# Patient Record
Sex: Male | Born: 1955 | Race: Black or African American | Hispanic: No | Marital: Married
Health system: Southern US, Community
[De-identification: ages and names within clinical notes are randomized; demographics above are authoritative.]

## PROBLEM LIST (undated history)

## (undated) DIAGNOSIS — I10 Essential (primary) hypertension: Secondary | ICD-10-CM

## (undated) DIAGNOSIS — M109 Gout, unspecified: Secondary | ICD-10-CM

## (undated) DIAGNOSIS — G4730 Sleep apnea, unspecified: Secondary | ICD-10-CM

## (undated) HISTORY — DX: Gout, unspecified: M10.9

## (undated) HISTORY — DX: Essential (primary) hypertension: I10

## (undated) HISTORY — PX: APPENDECTOMY (OPEN): SHX54

## (undated) HISTORY — PX: REPLACEMENT TOTAL KNEE BILATERAL: SUR1225

## (undated) HISTORY — DX: Sleep apnea, unspecified: G47.30

---

## 2013-10-08 ENCOUNTER — Other Ambulatory Visit (FREE_STANDING_LABORATORY_FACILITY): Payer: BLUE CROSS/BLUE SHIELD

## 2013-10-08 ENCOUNTER — Ambulatory Visit (INDEPENDENT_AMBULATORY_CARE_PROVIDER_SITE_OTHER): Payer: BLUE CROSS/BLUE SHIELD | Admitting: Internal Medicine

## 2013-10-08 ENCOUNTER — Encounter (INDEPENDENT_AMBULATORY_CARE_PROVIDER_SITE_OTHER): Payer: Self-pay | Admitting: Internal Medicine

## 2013-10-08 VITALS — BP 128/82 | HR 54 | Temp 98.2°F | Resp 12 | Ht 69.0 in | Wt 299.0 lb

## 2013-10-08 DIAGNOSIS — E559 Vitamin D deficiency, unspecified: Secondary | ICD-10-CM

## 2013-10-08 DIAGNOSIS — Z9889 Other specified postprocedural states: Secondary | ICD-10-CM

## 2013-10-08 DIAGNOSIS — M1A00X0 Idiopathic chronic gout, unspecified site, without tophus (tophi): Secondary | ICD-10-CM

## 2013-10-08 DIAGNOSIS — M1A9XX0 Chronic gout, unspecified, without tophus (tophi): Secondary | ICD-10-CM | POA: Insufficient documentation

## 2013-10-08 DIAGNOSIS — I10 Essential (primary) hypertension: Secondary | ICD-10-CM

## 2013-10-08 DIAGNOSIS — M170 Bilateral primary osteoarthritis of knee: Secondary | ICD-10-CM | POA: Insufficient documentation

## 2013-10-08 DIAGNOSIS — IMO0002 Reserved for concepts with insufficient information to code with codable children: Secondary | ICD-10-CM

## 2013-10-08 MED ORDER — ALLOPURINOL 100 MG PO TABS
100.0000 mg | ORAL_TABLET | Freq: Every day | ORAL | Status: DC
Start: 2013-10-08 — End: 2013-10-08

## 2013-10-08 MED ORDER — ALLOPURINOL 100 MG PO TABS
100.0000 mg | ORAL_TABLET | Freq: Every day | ORAL | Status: DC
Start: 2013-10-08 — End: 2013-12-26

## 2013-10-08 MED ORDER — DILTIAZEM HCL ER COATED BEADS 360 MG PO TB24
360.0000 mg | ORAL_TABLET | Freq: Every day | ORAL | Status: DC
Start: 2013-10-08 — End: 2013-12-26

## 2013-10-08 NOTE — Progress Notes (Signed)
1. Have you self referred yourself since we last saw you? no  Refer to care team   Or   Add specialists:

## 2013-10-08 NOTE — Progress Notes (Signed)
PROGRESS NOTE    Date Time: 10/08/2013 10:01 AM  Patient Name: [facility]    Chief Complaint   Patient presents with   . Medication Refill       Subjective:   Patient is a 58 y.o. male with PMH as below is here new patient Visit.  Patient has multiple medical complaints today.  Patient recently had a physical exam with his doctor who is a cardiologist in the [employer], would like to be screened for vitamin D deficiency.  Patient has a history of hypertension and is currently taken Cardizem 360 mg once daily.  Patient is also has a past medical history of gout, currently taken allopurinol as well.  Patient reports symptom has been stable on Cardizem, however, does not check his blood pressure regularly at home.  Denies any history of atrial fibrillation, diabetes, high cholesterol.  Patient reports Cardizem was given by his previous internal medicine physician.    Other topics discussed today including history of degenerated knee osteoarthritis of both knees.  Patient reports history of previous knee surgeries, both left and right, last surgery was on the right knee in February 2015.  Patient was given a temporary ?  Disability placard from the [facility] until this December, patient is requesting for continuation.  HISTORY:  Past Medical History   Diagnosis Date   . Hypertension    . Gout      Past Surgical History   Procedure Laterality Date   . Replacement total knee bilateral     . Appendectomy       Family History   Problem Relation Age of Onset   . Cancer Father      History     Social History   . Marital Status: Married     Spouse Name: N/A     Number of Children: N/A   . Years of Education: N/A     Social History Main Topics   . Smoking status: Never Smoker    . Smokeless tobacco: None   . Alcohol Use: 1.2 oz/week     2 Not specified per week   . Drug Use: No   . Sexual Activity:     Partners: Female     Other Topics Concern   . None     Social History Narrative   . None       There is no immunization history on  file for this patient.    Review of Systems:     Pertinent review of systems as in HPI.        Physical Exam:   BP 128/82 mmHg  Pulse 54  Temp(Src) 98.2 F (36.8 C) (Oral)  Resp 12  Ht 1.753 m (5\' 9" )  Wt 135.626 kg (299 lb)  BMI 44.13 kg/m2  SpO2 99% Body mass index is 44.13 kg/(m^2).    General appearance - sitting up in chair, comfortable   Eyes - pupils equal and reactive, normal conjunctiva  HEENT - supple, thyroid not enlarged   CV - normal S1, S2, no murmurs  Resp - normal respiratory effort, clear breath sound bilaterally   Abdomen - soft, non tender palpation, no splenomegaly   Musculoskeletal: normal gait, extremities with no clubbing     Assessment/Plan:     1. Essential hypertension  -blood pressure seems to be stable, however, patient does not check his blood pressure regularly.  Given history of gout.  Would stay away from drug choice which is hydrochlorothiazide.  Can continue Cardizem  for now.  However, would watch heart rate and disease in the mid 50s at this time.     2. H/O knee surgery  -history of knee surgeries due to degenerated arthritis from overuse from playing sports, patient is requesting for extension.  I discussed with the patient, we will need some medical records from the orthopedic doctor from [place] where he has surgery done, will need to touch base with his orthopedic to get his input on how much longer this patient needs.  Patient verbalized understanding    3. Osteoarthritis of both knees, unspecified osteoarthritis type  -as above    4. Vitamin D deficiency  Vitamin D- 25 Hydroxy (D2/D3/Total).  -Requested by his doctor from the [employer], I discussed that his insurance might not cover for this, patient verbalized understanding.     5. Chronic gout without tophus, unspecified cause, unspecified site  -currently taken allopurinol tolerating the medication well, continue this medication.  Monitor for potential side effects.        Signed by: Lynelle Doctor, MD  Internal Medicine

## 2013-10-10 LAB — QUESTASSURED(TM)25-OH VIT D(D2,D3), LC/MS/MS)>3YR)
Vitamin D 25-OH D2: <4 ng/mL
Vitamin D 25-OH D3: 16 ng/mL
Vitamin D, 25-OH, Total: 16 ng/mL — ABNORMAL LOW (ref 30–100)

## 2013-12-26 ENCOUNTER — Encounter (INDEPENDENT_AMBULATORY_CARE_PROVIDER_SITE_OTHER): Payer: Self-pay | Admitting: Internal Medicine

## 2013-12-26 ENCOUNTER — Ambulatory Visit (INDEPENDENT_AMBULATORY_CARE_PROVIDER_SITE_OTHER): Payer: BLUE CROSS/BLUE SHIELD | Admitting: Internal Medicine

## 2013-12-26 VITALS — BP 122/72 | HR 54 | Temp 98.9°F | Resp 12 | Ht 69.0 in | Wt 303.0 lb

## 2013-12-26 DIAGNOSIS — M1A9XX0 Chronic gout, unspecified, without tophus (tophi): Secondary | ICD-10-CM

## 2013-12-26 DIAGNOSIS — I10 Essential (primary) hypertension: Secondary | ICD-10-CM

## 2013-12-26 DIAGNOSIS — L910 Hypertrophic scar: Secondary | ICD-10-CM

## 2013-12-26 MED ORDER — ALLOPURINOL 100 MG PO TABS
100.0000 mg | ORAL_TABLET | Freq: Every day | ORAL | Status: DC
Start: 2013-12-26 — End: 2017-08-28

## 2013-12-26 MED ORDER — CLINDAMYCIN PHOSPHATE 1 % EX GEL
Freq: Two times a day (BID) | CUTANEOUS | Status: AC
Start: 2013-12-26 — End: 2014-03-26

## 2013-12-26 MED ORDER — ALLOPURINOL 100 MG PO TABS
100.0000 mg | ORAL_TABLET | Freq: Every day | ORAL | Status: DC
Start: 2013-12-26 — End: 2013-12-26

## 2013-12-26 MED ORDER — DILTIAZEM HCL ER COATED BEADS 360 MG PO TB24
360.00 mg | ORAL_TABLET | Freq: Every day | ORAL | Status: DC
Start: 2013-12-26 — End: 2017-08-28

## 2013-12-26 MED ORDER — DILTIAZEM HCL ER COATED BEADS 360 MG PO TB24
360.0000 mg | ORAL_TABLET | Freq: Every day | ORAL | Status: DC
Start: 2013-12-26 — End: 2013-12-26

## 2013-12-26 NOTE — Addendum Note (Signed)
Addended by: Agata Lucente G on: 12/26/2013 02:42 PM     Modules accepted: Orders

## 2013-12-26 NOTE — Progress Notes (Signed)
1. Have you self referred yourself since we last saw you? no  Refer to care team   Or   Add specialists:

## 2013-12-26 NOTE — Progress Notes (Signed)
PROGRESS NOTE    Date Time: 12/26/2013 10:23 AM  Patient Name: [facility]    Chief Complaint   Patient presents with   . Hypertension       Subjective:   Patient is a 58 y.o. male with PMH as below is here follow up visit for HTN and Gout.  Pt denies any chest pain, sob, palpitation, or diaphoresis today.  Reports he has been consistent with medications for Gout and HTN.  Blood pressure has been stable at home, in the 120s/80s on average.  Tolerating the medications with no significant SEs.    Other topics discussed today include hx of Keloid over his neck.  Pt has been taking Clindamycin gel in the past with good response.  Pt would like refill of this medication today.      HISTORY:  Past Medical History   Diagnosis Date   . Hypertension    . Gout      Past Surgical History   Procedure Laterality Date   . Replacement total knee bilateral     . Appendectomy       Family History   Problem Relation Age of Onset   . Cancer Father      History     Social History   . Marital Status: Married     Spouse Name: N/A     Number of Children: N/A   . Years of Education: N/A     Social History Main Topics   . Smoking status: Never Smoker    . Smokeless tobacco: None   . Alcohol Use: 1.2 oz/week     2 Not specified per week   . Drug Use: No   . Sexual Activity:     Partners: Female     Other Topics Concern   . None     Social History Narrative       There is no immunization history on file for this patient.    Review of Systems:     Pertinent review of systems as in HPI.        Physical Exam:   BP 122/72 mmHg  Pulse 54  Temp(Src) 98.9 F (37.2 C) (Oral)  Resp 12  Ht 1.753 m (5\' 9" )  Wt 137.44 kg (303 lb)  BMI 44.72 kg/m2  SpO2 98% Body mass index is 44.72 kg/(m^2).    General appearance - sitting up in chair, comfortable   Eyes - pupils equal and reactive, normal conjunctiva  HEENT - supple, thyroid not enlarged   CV - normal S1, S2, no murmurs  Resp - normal respiratory effort, clear breath sound bilaterally    Abdomen - soft, non tender palpation, no splenomegaly   Musculoskeletal: normal gait, extremities with no clubbing     Assessment/Plan:     1. Essential hypertension  Stable, cont Cardizem at current dosage, monitor for potential effects.  Blood pressure has been averaging in 120/80.     2. Chronic gout without tophus, unspecified cause, unspecified site  Stable, cont allopurinol.   3. Keloid  Stable, refill clindamycin gel.        Signed by: Lynelle Doctor, MD  Internal Medicine

## 2017-05-04 ENCOUNTER — Encounter: Payer: Self-pay | Admitting: Pain Medicine

## 2017-05-04 NOTE — Anesthesia Preprocedure Evaluation (Addendum)
Anesthesia Evaluation    AIRWAY    Mallampati: II    TM distance: >3 FB  Neck ROM: full  Mouth Opening:full  Planned to use difficult airway equipment: No CARDIOVASCULAR    cardiovascular exam normal, regular and normal       DENTAL         PULMONARY    pulmonary exam normal     OTHER FINDINGS                  Relevant Problems   (+) Essential hypertension       PSS Anesthesia Comments: HTN, Gout, Knee replacmnet, Morbid obesity, BMI 44,8. STOP BANG=6        Anesthesia Plan    ASA 3     general                     intravenous induction   Detailed anesthesia plan: general IV  Monitors/Adjuncts: other    Post Op: other  Post op pain management: other    informed consent obtained    Plan discussed with CRNA.                   Signed by: Juanell Fairly 05/04/17 6:42 PM

## 2017-05-07 ENCOUNTER — Ambulatory Visit
Admission: RE | Admit: 2017-05-07 | Discharge: 2017-05-07 | Disposition: A | Discharge status: Home / Self Care | Payer: BLUE CROSS/BLUE SHIELD | Source: Ambulatory Visit | Attending: Gastroenterology | Admitting: Gastroenterology

## 2017-05-07 ENCOUNTER — Ambulatory Visit: Payer: BLUE CROSS/BLUE SHIELD | Admitting: Pain Medicine

## 2017-05-07 ENCOUNTER — Encounter
Admission: RE | Disposition: A | Discharge status: Home / Self Care | Payer: Self-pay | Source: Ambulatory Visit | Attending: Gastroenterology

## 2017-05-07 DIAGNOSIS — I10 Essential (primary) hypertension: Secondary | ICD-10-CM | POA: Insufficient documentation

## 2017-05-07 DIAGNOSIS — K219 Gastro-esophageal reflux disease without esophagitis: Secondary | ICD-10-CM | POA: Insufficient documentation

## 2017-05-07 DIAGNOSIS — K449 Diaphragmatic hernia without obstruction or gangrene: Secondary | ICD-10-CM | POA: Insufficient documentation

## 2017-05-07 DIAGNOSIS — D120 Benign neoplasm of cecum: Secondary | ICD-10-CM

## 2017-05-07 DIAGNOSIS — K210 Gastro-esophageal reflux disease with esophagitis: Secondary | ICD-10-CM | POA: Insufficient documentation

## 2017-05-07 DIAGNOSIS — K5730 Diverticulosis of large intestine without perforation or abscess without bleeding: Secondary | ICD-10-CM | POA: Insufficient documentation

## 2017-05-07 DIAGNOSIS — Z1211 Encounter for screening for malignant neoplasm of colon: Secondary | ICD-10-CM | POA: Insufficient documentation

## 2017-05-07 DIAGNOSIS — K2970 Gastritis, unspecified, without bleeding: Secondary | ICD-10-CM | POA: Insufficient documentation

## 2017-05-07 DIAGNOSIS — D123 Benign neoplasm of transverse colon: Secondary | ICD-10-CM | POA: Insufficient documentation

## 2017-05-07 DIAGNOSIS — K635 Polyp of colon: Secondary | ICD-10-CM | POA: Insufficient documentation

## 2017-05-07 DIAGNOSIS — M109 Gout, unspecified: Secondary | ICD-10-CM | POA: Insufficient documentation

## 2017-05-07 DIAGNOSIS — K648 Other hemorrhoids: Secondary | ICD-10-CM | POA: Insufficient documentation

## 2017-05-07 HISTORY — PX: EGD, COLONOSCOPY: SHX3799

## 2017-05-07 SURGERY — EGD, COLONOSCOPY
Anesthesia: Anesthesia General | Site: Abdomen | Wound class: Clean Contaminated

## 2017-05-07 MED ORDER — FENTANYL CITRATE (PF) 50 MCG/ML IJ SOLN (WRAP)
INTRAMUSCULAR | Status: AC
Start: 2017-05-07 — End: ?
  Filled 2017-05-07: qty 2

## 2017-05-07 MED ORDER — PROPOFOL 10 MG/ML IV EMUL (WRAP)
INTRAVENOUS | Status: AC
Start: 2017-05-07 — End: ?
  Filled 2017-05-07: qty 20

## 2017-05-07 MED ORDER — LACTATED RINGERS IV SOLN
INTRAVENOUS | Status: DC
Start: 2017-05-07 — End: 2017-05-07

## 2017-05-07 MED ORDER — FENTANYL CITRATE (PF) 50 MCG/ML IJ SOLN (WRAP)
INTRAMUSCULAR | Status: DC | PRN
Start: 2017-05-07 — End: 2017-05-07
  Administered 2017-05-07: 50 ug via INTRAVENOUS

## 2017-05-07 MED ORDER — LIDOCAINE HCL 2 % IJ SOLN
INTRAMUSCULAR | Status: DC | PRN
Start: 2017-05-07 — End: 2017-05-07
  Administered 2017-05-07: 60 mg

## 2017-05-07 MED ORDER — LIDOCAINE HCL 2 % IJ SOLN
INTRAMUSCULAR | Status: AC
Start: 2017-05-07 — End: ?
  Filled 2017-05-07: qty 20

## 2017-05-07 MED ORDER — SODIUM CHLORIDE 0.9 % IV SOLN
INTRAVENOUS | Status: DC
Start: 2017-05-07 — End: 2017-05-07

## 2017-05-07 MED ORDER — GLYCOPYRROLATE 0.2 MG/ML IJ SOLN
INTRAMUSCULAR | Status: DC | PRN
Start: 2017-05-07 — End: 2017-05-07
  Administered 2017-05-07: .2 mg via INTRAVENOUS

## 2017-05-07 MED ORDER — PROPOFOL 10 MG/ML IV EMUL (WRAP)
INTRAVENOUS | Status: DC | PRN
Start: 2017-05-07 — End: 2017-05-07
  Administered 2017-05-07: 20 mg via INTRAVENOUS
  Administered 2017-05-07: 10 mg via INTRAVENOUS
  Administered 2017-05-07: 20 mg via INTRAVENOUS
  Administered 2017-05-07: 10 mg via INTRAVENOUS
  Administered 2017-05-07: 20 mg via INTRAVENOUS
  Administered 2017-05-07 (×4): 10 mg via INTRAVENOUS
  Administered 2017-05-07 (×2): 20 mg via INTRAVENOUS
  Administered 2017-05-07: 30 mg via INTRAVENOUS
  Administered 2017-05-07: 20 mg via INTRAVENOUS
  Administered 2017-05-07: 30 mg via INTRAVENOUS
  Administered 2017-05-07: 10 mg via INTRAVENOUS
  Administered 2017-05-07: 20 mg via INTRAVENOUS
  Administered 2017-05-07 (×5): 10 mg via INTRAVENOUS

## 2017-05-07 SURGICAL SUPPLY — 39 items
BLOCK BITE MAXI 60FR LF STRD STRAP SDPRT (Procedure Accessories) ×1
BLOCK BITE OD60 FR STURDY STRAP SIDEPORT (Procedure Accessories) ×1
BLOCK BITE OD60 FR STURDY STRAP SIDEPORT DENTAL RETENTION RIM MAXI (Procedure Accessories) ×1 IMPLANT
ELECTRODE ADULT PATIENT RETURN L9 FT REM POLYHESIVE ACRYLIC FOAM (Procedure Accessories) IMPLANT
ELECTRODE PATIENT RETURN L9 FT VALLEYLAB (Procedure Accessories)
ELECTRODE PT RTN RM PHSV ACRL FM C30- LB (Procedure Accessories)
FORCEPS BIOPSY L240 CM JUMBO MICROMESH (Instrument) ×1
FORCEPS BIOPSY L240 CM JUMBO MICROMESH TEETH STREAMLINE CATHETER (Instrument) IMPLANT
FORCEPS BIOPSY L240 CM LARGE CAPACITY (Instrument) ×1
FORCEPS BIOPSY L240 CM MICROMESH TEETH STREAMLINE CATHETER NEEDLE (Instrument) IMPLANT
FORCEPS BX SS JMB RJ 4 2.8MM 240CM STRL (Instrument) ×1
FORCEPS BX SS LG CPC RJ 4 2.4MM 240CM (Instrument) ×1
GLOVE EXAM LARGE NITRILE CHEMOTHERAPY POWDER FREE SENSE OATMEAL (Glove) ×1 IMPLANT
GLOVE EXAM LARGE NITRILE POWDER FREE SENSE OATMEAL (Glove) ×1 IMPLANT
GLOVE EXAM NITRILE RESTORE LG (Glove) ×1
GLV EXAM NITRILE RESTORE LG (Glove) ×2
GOWN ISL PP PE REG LG LF FULL BCK NK TIE (Gown) ×4
GOWN ISOLATION REGULAR LARGE FULL BACK NECK TIE ELASTIC CUFF (Gown) ×2 IMPLANT
MASK FLUID SHIELD W WRAP (Personal Protection) ×4 IMPLANT
NEEDLE CARR-LOCKE INJECT 25GX5 (Needles) IMPLANT
SNARE 9 MM L230 CM OD2.4 MM COLD BRAID (Instrument) ×1
SNARE 9 MM L230 CM OD2.4 MM EXACTO COLD BRAID WIRE CLEAN CUT (Instrument) IMPLANT
SNARE ESCP 9MM EXACTO 2.4MM 230CM LF (Instrument) ×1
SNARE ESCP MIC CPTVTR 13MM 240IN STRL (GE Lab Supplies)
SNARE SMALL HEXAGON CAPTIVATOR STIFF ENDOSCOPIC POLYPECTOMY (GE Lab Supplies) IMPLANT
SPONGE GAUZE L4 IN X W4 IN 16 PLY (Dressing) ×1
SPONGE GAUZE L4 IN X W4 IN 16 PLY MAXIMUM ABSORBENT USP TYPE VII (Dressing) ×1 IMPLANT
SPONGE GZE CTTN CRTY 4X4IN LF NS 16 PLY (Dressing) ×1
SYRINGE 50 ML GRADUATE NONPYROGENIC DEHP (Syringes, Needles)
SYRINGE 50 ML GRADUATE NONPYROGENIC DEHP FREE PVC FREE BD MEDICAL (Syringes, Needles) ×1 IMPLANT
SYRINGE MED 50ML LF STRL GRAD N-PYRG (Syringes, Needles)
TRAP MCS PLS LF STRL SCR CAP TUBE ID LBL (Procedure Accessories)
TRAP MUCUS SCREW CAP TUBE ID LABEL (Procedure Accessories)
TRAP MUCUS SCREW CAP TUBE ID LABEL MEDLINE PLASTIC CLEAR (Procedure Accessories) IMPLANT
TRAP SPEC REM ETRAP 15CM LF STRL MAGNIFY (Procedure Accessories) ×1
TRAP SPECIMEN REMOVAL L15 CM MAGNIFY (Procedure Accessories) ×1
TRAP SPECIMEN REMOVAL L15 CM MAGNIFY WINDOW MEASUREMENT GUIDE ETRAP (Procedure Accessories) IMPLANT
WATER STERILE PLASTIC POUR BOTTLE 250 ML (Irrigation Solutions) ×1 IMPLANT
WATER STRL 250ML LF PLS PR BTL (Irrigation Solutions) ×1

## 2017-05-07 NOTE — Discharge Instr - AVS First Page (Addendum)
Reason for your Hospital Admission:  ***      Instructions for after your discharge:  ***    Colonoscopy Discharge Instructions  General Instructions:  1. Following sedation, your judgement, perception, and coordination are considered impaired. Even though you may feel awake and alert, you are considered legally intoxicated. Therefore, until the next morning;   Do not Drive   Do not operate appliances or equipment that requires reaction time (e.g.Stove, electrical tools, machinery)   Do not sign legal documents or be involved in important decisions.   Do not smoke if alone   Do not drink alcoholic beverages   Go directly home and rest for several hours before resuming your routine activities.   It is highly recommended to have a responsible adult stay with you for the next 24 hours    2. Tenderness, swelling or pain may occur at the IV site where you received sedation. If you experience this, apply warm soaks to the area. Notify your physician if this persists.    Instructions Specific To Procedures - Report To Physician Any Of The Following:    Colon/Sigmoidoscopy/Proctoscopy   1. Severe and persistent abdominal pain/bloating which does not subside within 2-3 hours   2. Large amount of rectal bleeding (some mucosal blood streaking may occur, especially if biopsy or polypectomy was done or if hemorrhoids are present.   3. Nausea/vomitting   4. Fevers/Chills within 24 hours after procedure. Temp>101deg F     In Addition:   If polyp has been removed, DO NOT take aspirin or aspirin containing products (e.g. Anacin, Alka Seltzer, Bufferin, Etc.) or non-steroidal anti-inflammatory drugs (e.g. Advil, Motrin, etc.) for *** days unless otherwise advised by doctor. Tylenol  or extra Strength Tylenol is permitted.    Additional Discharge Instructions  Your diet after the procedure:  Start with something light (Toast, Jello, Soup, Etc.), Then Resume to Regular Diet as Tolerated. Nothing Spicy, Greasy or [employer]  for the [employer].  Special Instructions: ***  Prescriptions given: {YES (DEF)/NO:21773}  Patient education literature given; {YES (DEF)/NO:21773}      If you have questions or problems contact your MD immediately. If you need immediate attention, call your MD, 911 and/or go to nearest emergency room.  Endoscopy Discharge Instructions  General Instructions:  1. Following sedation, your judgement, perception, and coordination are considered impaired. Even though you may feel awake and alert, you are considered legally intoxicated. Therefore, until the next morning;   Do not Drive   Do not operate appliances or equipment that requires reaction time (e.g. stove, electrical tools, machinery)   Do not sign legal documents or be involved in important decisions.   Do not smoke if alone   Do not drink alcoholic beverages   Go directly home and rest for several hours before resuming your routine activities.   It is highly recommended to have a responsible adult stay with you for the next 24 hours    2. Tenderness, swelling or pain may occur at the IV site where you received sedation. If you experience this, apply warm soaks to the area. Notify your physician if this persists.    Instructions Specific To Procedures - Report To Physician Any Of The Following:    Upper Endoscopy, ERCP, Dilations   1. Pain in Chest   2. Nausea/vomitting   3. Fevers/Chills within 24 hours after procedure. Temp>101deg F   4. Severe and persistent abdominal pain and bloating     In Addition:  Mild throat soreness may follow this procedure. Warm salt water gargling or    lozenges of your choice will most likely relieve your discomfort or cold drinks and   popsicles.         BAdditional Discharge Instructions  Your diet after the procedure: {Gi diet instructions:110015}  Special Instructions No red dye foods for 24 hours  Prescriptions given: {YES (DEF)/NO:21773}  Patient education literature given; {YES (DEF)/NO:21773}      If you have  questions or problems contact your MD immediately. If you need immediate attention, call your MD, 911 and/or go to nearest emergency room.

## 2017-05-07 NOTE — H&P (Signed)
GI PRE PROCEDURE NOTE    Proceduralist Comments:   Review of Systems and Past Medical / Surgical History performed: Yes     Indications:GERD  and Colon cancer screening    Previous Adverse Reaction to Anesthesia or Sedation (if yes, describe): No    Physical Exam / Laboratory Data (If applicable)   General: Alert and cooperative  Lungs: Lungs clear to auscultation  Cardiac: RRR, normal S1S2.    Abdomen: Soft, non tender. Normal active bowel sounds  Other:     No labs drawn    American Society of Anesthesiologists (ASA) Physical Status Classification:   Anesthesia ASA Score: 3          Planned Sedation:   Deep sedation with anesthesia    Attestation:   Kurt Bates has been reassessed immediately prior to the procedure and is an appropriate candidate for the planned sedation and procedure. Risks, benefits and alternatives to the planned procedure and sedation have been explained to the patient or guardian:  yes        Signed by: Annalee Genta

## 2017-05-07 NOTE — Transfer of Care (Signed)
A96%, HR = nesthesia Transfer of Care Note  , ,   Patient: Kurt Bates    Procedures performed: Procedure(s) with comments:  EGD, COLONOSCOPY - EGD, COLONOSCOPY  Q1=UNK    Anesthesia type: General TIVA    Patient location:Phase II PACU    Last vitals:   sats = 96%, HR = 62, 132/82    Post pain: Patient not complaining of pain, continue current therapy      Mental Status:awake    Respiratory Function: tolerating room air    Cardiovascular: stable    Nausea/Vomiting: patient not complaining of nausea or vomiting    Hydration Status: adequate    Post assessment: no apparent anesthetic complications, no reportable events and no evidence of recall     VSS< report given    Signed by: Betsy Pries  05/07/17 3:00 PM    //

## 2017-05-07 NOTE — Anesthesia Postprocedure Evaluation (Signed)
Anesthesia Post Evaluation    Patient: Kurt Bates    Procedure(s) with comments:  EGD, COLONOSCOPY - EGD, COLONOSCOPY  Q1=UNK    Anesthesia type: general    Last Vitals:   Vitals:    05/07/17 1530   BP: 139/90   Pulse: (!) 50   Resp: 16   Temp:    SpO2: 96%       Anesthesia Post Evaluation:     Patient Evaluated: PACU  Patient Participation: complete - patient participated  Level of Consciousness: awake  Pain Score: 0  Pain Management: adequate    Airway Patency: patent    Anesthetic complications: No      PONV Status: none    Cardiovascular status: stable  Respiratory status: room air  Hydration status: stable        Signed by: Juanell Fairly, 05/07/2017 3:46 PM

## 2017-05-08 ENCOUNTER — Encounter: Payer: Self-pay | Admitting: Gastroenterology

## 2017-05-15 ENCOUNTER — Encounter (INDEPENDENT_AMBULATORY_CARE_PROVIDER_SITE_OTHER): Payer: Self-pay | Admitting: Internal Medicine

## 2017-05-15 DIAGNOSIS — Z86010 Personal history of colonic polyps: Secondary | ICD-10-CM | POA: Insufficient documentation

## 2017-08-28 ENCOUNTER — Ambulatory Visit (HOSPITAL_BASED_OUTPATIENT_CLINIC_OR_DEPARTMENT_OTHER): Payer: BLUE CROSS/BLUE SHIELD | Admitting: Surgery

## 2017-08-28 ENCOUNTER — Encounter (HOSPITAL_BASED_OUTPATIENT_CLINIC_OR_DEPARTMENT_OTHER): Payer: Self-pay | Admitting: Surgery

## 2017-08-28 DIAGNOSIS — K449 Diaphragmatic hernia without obstruction or gangrene: Secondary | ICD-10-CM

## 2017-08-28 DIAGNOSIS — I10 Essential (primary) hypertension: Secondary | ICD-10-CM

## 2017-08-28 DIAGNOSIS — M1A9XX0 Chronic gout, unspecified, without tophus (tophi): Secondary | ICD-10-CM

## 2017-08-28 DIAGNOSIS — Z6841 Body Mass Index (BMI) 40.0 and over, adult: Secondary | ICD-10-CM

## 2017-08-28 DIAGNOSIS — G4733 Obstructive sleep apnea (adult) (pediatric): Secondary | ICD-10-CM

## 2017-08-28 DIAGNOSIS — K219 Gastro-esophageal reflux disease without esophagitis: Secondary | ICD-10-CM

## 2017-08-28 DIAGNOSIS — G4730 Sleep apnea, unspecified: Secondary | ICD-10-CM | POA: Insufficient documentation

## 2017-08-28 DIAGNOSIS — M170 Bilateral primary osteoarthritis of knee: Secondary | ICD-10-CM

## 2017-08-28 NOTE — Progress Notes (Signed)
Assessment:  Morbid obesity  BMI of 48  Hypertension.  Gout.  Bilateral knee arthritis.  Obstructive sleep apnea on CPAP.  Hiatal hernia.  Gastroesophageal reflux disease         Plan:  In brief,  [name]  is a 62 y.o. year old morbidly obese patient with comorbid conditions who has expressed an interest in robotic/laparoscopic RNY-Gastric Bypass. Patient was given a preoperative testing check list which includes dietary and psychiatric counseling for behavioral modification and medical/cardiac clearance (if indicated). Patient will be seen in the office on multiple occasions preoperatively.   In the best interest of this patients overall success, we would appreciate your assistance with changing medications to chewable, crushable and/or liquid form for postoperative period and necessary preoperative clearances.      History of Present Illness:  I had a pleasure of seeing [name] in the office in consultation for possible bariatric surgery to resolve and treat morbid obesity and comorbid conditions. Patient has tried and failed multiple previous attempts at conservative weight loss programs. Bariatric comorbidities present: hypertension, GERD, osteoarthritis and .  Obstructive sleep apnea on CPAP  We had a long discussion and thoroughly reviewed past medical and surgical history. he has attended an informational seminar/webinar and was given a booklet summarizing weight loss surgery options, risks and results. The surgical options discussed include robotic/laparoscopic Roux-en Y gastric bypass, laparoscopic adjustable gastric banding and robotic/laparoscopic sleeve gastrectomy. All questions and concerns were addressed in detail.       The following portions of the patient's history were reviewed and updated as appropriate: allergies, current medications, past family history, past medical history, past social history, past surgical history and problem list.  CURRENT  PROBLEM LIST:   Patient Active Problem List   Diagnosis   . Chronic gout without tophus, unspecified cause, unspecified site   . Essential hypertension   . H/O knee surgery   . Osteoarthritis of both knees, unspecified osteoarthritis type   . Keloid   . Hx of colonic polyps   . Morbid obesity with BMI of 45.0-49.9, adult   . Sleep apnea   . Gastroesophageal reflux disease without esophagitis   . Hiatal hernia     PAST MEDICAL HISTORY:   Past Medical History:   Diagnosis Date   . Gout    . Hypertension    . Sleep apnea     CPAP     PAST SURGICAL HISTORY:   Past Surgical History:   Procedure Laterality Date   . APPENDECTOMY     . EGD, COLONOSCOPY N/A 05/07/2017    Procedure: EGD, COLONOSCOPY;  Surgeon: Annalee Genta, MD;  Location: [facility];  Service: Gastroenterology;  Laterality: N/A;  EGD, COLONOSCOPY  Q1=UNK   . REPLACEMENT TOTAL KNEE BILATERAL       FAMILY HISTORY:    Family History   Problem Relation Age of Onset   . Cancer Father      SOCIAL HISTORY:   Social History     Social History   . Marital status: Married     Spouse name: N/A   . Number of children: N/A   . Years of education: N/A     Social History Main Topics   . Smoking status: Never Smoker   . Smokeless tobacco: Never Used   . Alcohol use 1.8 oz/week     2 Standard drinks or equivalent, 1 Shots of liquor per week   . Drug use:  No   . Sexual activity: Yes     Partners: Female     Other Topics Concern   . Dietary Supplements / Vitamins No   . Anesthesia Problems No   . Blood Thinners No   . Future Children No   . Number Of Children Yes     3   . Eats Large Amounts Yes   . Excessive Sweets Yes   . Skips Meals No   . Eats Excessive Starches Yes   . Snacks Or Grazes Yes   . Emotional Eater Yes   . Eats Fried Food Yes   . Eats Fast Food Yes   . Diet Center No   . Hmr No   . Doylene Bode No   . La Weight Loss No   . Nutri-System No   . Opti-Fast / Medi-Fast No   . Overeaters Anonymous No   . Physicians Weight Loss Center No   . Tops No   . Weight  Watchers No   . Atkins No   . Binging / Purging No   . Body For Life No   . Cabbage Soup No   . Calorie Counting No   . Fasting No   . Berline Chough No   . Health Spa No   . Herbal Life No   . High Protein No   . Low Carb No   . Low Fat No   . Mayo [facility] Diet No   . [place] Diet No   . Margie Billet Diet No   . [place] Diet No   . Slim Fast No   . [place] No   . Sugar Busters No   . Vomiting No   . Zone Diet No   . Stationary Cycle Or Treadmill Yes   . Gym/Fitness Classes No   . Home Exercise/Video No   . Swimming No   . Team Sports Yes   . Weight Training Yes   . Walking Or Running No   . Hospitalization No   . Hypnosis No   . Physical Therapy No   . Psychological Therapy No   . Residential Program No   . Acutrim No   . Amphetamines No   . Anorex No   . Byetta No   . Dexatrim No   . Didrex No   . Fastin No   . Fen - Phen No   . Ionamin / Adipex No   . Mazanor No   . Meridia No   . Obalan No   . Phendiet No   . Phentrol No   . Phenteramine No   . Plegine No   . Pondimin No   . Qsymia No   . Prozac No   . Redux No   . Sanorex No   . Tenuate No   . Tepanole No   . Wechless No   . Wellbutrin No   . Xenical (Orlistat, Alli) No   . Other Med No   . No Impairment No   . Walks With Cane/Crutch No   . Requires A Wheelchair No   . Bedridden No   . Are You Currently Being Treated For Depression? No   . Do You Snore? Yes   . Are You Receiving Any Medical Or Psychological Services? No   . Do You Ever Wake Up At [employer] For Breath? No   . Do You Have Or Have You Been Treated For An Eating Disorder? No   .  Anyone Ever Told You That You Stop Breathing While Asleep? No   . Do You Exercise Regularly? No   . Have You Or Family Member Ever Have Trouble With Anesthesia? No     Social History Narrative   . No narrative on file    (Does not refresh)  TOBACCO HISTORY:   History   Smoking Status   . Never Smoker   Smokeless Tobacco   . Never Used     ALCOHOL HISTORY:   History   Alcohol Use   . 1.8 oz/week   . 2 Standard  drinks or equivalent, 1 Shots of liquor per week     DRUG HISTORY:   History   Drug Use No     CURRENT HOSPITAL MEDICATIONS:   Current Outpatient Prescriptions   Medication Sig Dispense Refill   . amLODIPine-Valsartan-HCTZ 5-160-12.5 MG Tab Take 1 tablet by mouth daily  5     No current facility-administered medications for this visit.      CURRENT OUTPATIENT MEDICATIONS:   Outpatient Prescriptions Marked as Taking for the 08/28/17 encounter (Office Visit) with Almendra Loria R, DO   Medication Sig Dispense Refill   . amLODIPine-Valsartan-HCTZ 5-160-12.5 MG Tab Take 1 tablet by mouth daily  5     ALLERGIES: No Known Allergies     Review of Systems  Constitutional: negative for fevers, night sweats  Respiratory: negative for SOB, cough  Cardiovascular: negative for chest pain, palpitations  Gastrointestinal: negative for , nausea, vomiting  Genitourinary:negative for hematuria, dysuria  Musculoskeletal:negative for bone pain, myalgias and stiff joints  Neurological: negative for dizziness, gait problems, headaches and memory problems  Behavioral/Psych: negative for fatigue, loss of interest in favorite activities, separation anxiety and sleep disturbance  Endocrine: negative for temperature intolerance  Integumentary: No rashes, no skin infections    Objective:    BP 112/75 (BP Site: Left arm, Patient Position: Sitting, Cuff Size: Large)   Pulse 79   Temp (!) 46.8 F (8.2 C) (Oral)   Ht 5\' 8"    Wt 321 lb   BMI 48.81 kg/m   Body mass index is 48.81 kg/m.  Weight: 321 lb       General Appearance:    Alert, cooperative, no distress, obese   Head:    Normocephalic, without obvious abnormality, atraumatic   Eyes:    No scleral icterus            Throat:   Lips, mucosa, and tongue normal; teeth and gums normal   Neck:   Supple, symmetrical, trachea midline, no adenopathy;        thyroid:  No enlargement/tenderness/nodules; no carotid    bruit or JVD   Back:     Symmetric, no curvature, ROM normal, no CVA  tenderness   Lungs:     Clear to auscultation bilaterally, respirations unlabored   Chest wall:    No tenderness or deformity   Heart:    Regular rate and rhythm, S1 and S2 normal, no murmur, rub   or gallop   Abdomen:     Soft, non-tender, bowel sounds active all four quadrants,     no masses, no organomegaly   Extremities:   Extremities normal, atraumatic, no cyanosis or edema   Pulses:   2+ and symmetric all extremities   Skin:   Skin color, texture, turgor normal, no rashes or lesions   Lymph nodes:   Cervical, supraclavicular, and axillary nodes normal   Neurologic:   Alert and oriented x  3.  Able to move all extremities.       Data Review:     No visits with results within 3 Month(s) from this visit.   Latest known visit with results is:   Appointment on 10/08/2013   Component Date Value Ref Range Status   . Vitamin D, 25-OH, Total 10/08/2013 16* 30 - 100 ng/mL Final   . Vitamin D, 25-OH, D3 10/08/2013 16  ng/mL Final   . Vitamin D, 25-OH, D2 10/08/2013 <4  ng/mL Final    Comment: 25-OHD3 indicates both endogenous production and  supplementation. 25-OHD2 is an indicator of  exogenous sources such as diet or supplementation.  Therapy is based on measurement of Total 25-OHD,  with levels <20 ng/mL indicative of Vitamin D  deficiency while levels between 20 ng/mL and 30  ng/mL suggest insufficiency. Optimal levels are  > or = 30 ng/mL.  For more information on this test, go to  http://education.[facility].com/faq/FAQ163  Test Performed by [facility],  [facility],  [address], [place], [place] [address]  Si Raider, M.D., Ph.D., Director of Laboratories  [phone], CLIA [id number]       .  Radiology Results   No results found.                Carena Stream R. Sheralyn Pinegar, DO, FACS, FASMBS, FACOS

## 2017-09-11 ENCOUNTER — Ambulatory Visit (INDEPENDENT_AMBULATORY_CARE_PROVIDER_SITE_OTHER): Payer: BLUE CROSS/BLUE SHIELD

## 2017-09-11 DIAGNOSIS — Z719 Counseling, unspecified: Secondary | ICD-10-CM

## 2017-09-11 DIAGNOSIS — Z713 Dietary counseling and surveillance: Secondary | ICD-10-CM

## 2017-09-11 NOTE — Progress Notes (Signed)
S:  Weight hx: Pt is a 62 y.o. male with morbid obesity presenting for initial nutrition evaluation for possible bariatric surgery. Pt interested in bypass surgery.  Pt states desires weight loss surgery to help symptoms of co-morbid conditions and live a longer, more active lifestyle.  Pt has struggled with weight since childhood.  Pt has tried the following diet and/or exercise programs:  Self directed diets and exercise.  Pt reports losing the most weight using self directed diets.  Pt reports losing about 80 pounds.  Pt reports lowest weight maintained was 255#.  Pt reports their highest weight recorded was: 348# - this was in 2010.    Lifestyle/Activity Level/Exercise:  Pt participates in the following for physical activity:  Bicycle/elliptical and body weight exercises.  Pt reports the following reasons for inactivity:  Has 2 knee replacements.    Psychosocial History:  Pt states some emotional eating with stress or boredom.  Pt reports sweets as trigger foods.  Pt is able to identify the following emotions/situations that trigger them to eat:  stress.  Pt feels that they overeat/emotionally eat about 3-4 times weekly.       Allergies:  No Known Allergies    O:  Ht:    Ht Readings from Last 1 Encounters:   08/28/17 5\' 8"      Wt:  322 lb BMI:  Body mass index is 48.96 kg/m.   IBW:  169# Excess Wt:  150# ABW:  175#    Vitamin/Mineral Deficiency Hx:  Iron  Pt is currently taking the following OTC supplements:  Vitamin D.    Smoker:  none    Comorbidities:    Patient Active Problem List   Diagnosis   . Chronic gout without tophus, unspecified cause, unspecified site   . Essential hypertension   . H/O knee surgery   . Osteoarthritis of both knees, unspecified osteoarthritis type   . Keloid   . Hx of colonic polyps   . Morbid obesity with BMI of 45.0-49.9, adult   . Sleep apnea   . Gastroesophageal reflux disease without esophagitis   . Hiatal hernia     Diet Overview:  Food Recall:     Breakfast:  Skips       Lunch:  Grazes on:  Chips, ice cream, crackers     Dinner:  Fish/chicken, veggies and starch     Snacks:  Chips, crackers, ice cream, cookies, chocolate, candy bars     Beverages:  Water, gatorade, water, occ soda, OJ, cranberry juice, apple juice, coffee     Alcohol:  3-4 drinks on the weekend    Nutrition Diagnosis:  Pt presents with obesity related to a history of excessive energy intake, limited physical activity, and food and nutrition related knowledge deficit as evidenced by report, recall, and BMI.      A:  Per report and diet recall, consumes a diet high in sugar.  Discussed necessary diet changes related to bariatric surgery.  Per pt insurance, pt is required to complete 3 months of pre-surgery weight loss classes and classes will help to strengthen knowledge deficit and post-operative weight loss management.  Pt comprehension level moderate and anticipated compliance moderate.  Pt receives my recommendation as a good candidate for bariatric surgery.  Would recommend this pt for WLS.    P:  We discussed nutritional expectations prior to bariatric surgery including insurance-required goals and reduced surgical risks associated with wt loss prior to surgery.  Pt was provided with an overview of  pre-op and post-op dietary stages as well as lifelong vitamin/mineral supplementation requirements.  We discussed the value of tracking foods and measuring portions to increase awareness and accountability.  We also discussed the importance of consistent intake, appropriate protein intake and long-term commitment to physical activity.  Initial goals:    1.  Pt to continue with current weight loss program as required by insurance company.  Pt is required to complete 3 month program.  2.  Pt to review nutrition plan post - operatively and contact RD with questions.  Contact information provided.      Pt verbalized understanding and did not voice any additional questions.  Spent a total of 30 minutes educating pt in  a individual one-on-one setting.  Plan reviewed with Dr. Jeanell Sparrow

## 2017-10-09 ENCOUNTER — Ambulatory Visit (INDEPENDENT_AMBULATORY_CARE_PROVIDER_SITE_OTHER): Payer: Self-pay

## 2017-10-09 ENCOUNTER — Encounter (HOSPITAL_BASED_OUTPATIENT_CLINIC_OR_DEPARTMENT_OTHER): Payer: Self-pay

## 2017-10-09 DIAGNOSIS — Z6841 Body Mass Index (BMI) 40.0 and over, adult: Secondary | ICD-10-CM

## 2017-10-09 NOTE — Progress Notes (Signed)
Initial behavioral consult complete. Patient is a good candidate for surgery.  Demonstrated a good understanding of WLS and required lifestyle changes.    Pt is a married (2nd) father to one 63 year old daughter and two adult stepchildren, all living independently.  Pt reported that family is very supportive of the decision to have weight loss surgery. 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 Discussed how his schedule/travel has negatively impacted his weight and how to improve moving forward. Pt said he has always yo-yo'd with his weight and has lost significant amounts of weight several time, only to regain. Discussed behaviors, times of days, people, foods that contribute to his inability to manage his eating and discussed strategies, such as abstinence, to improve. Pt seemed receptive.   Again, biggest challenge to pt is his travel and many meals out. Pt denied hx of mental health or substance use. Denied hx of eating disorder. Pt. Is a nonsmoker.  Reported a couple of drinks per week. The negative consequences of alcohol post-surgery were clearly communicated to pt and pt demonstrated an adequate understanding, as well as expressed a verbal commitment to eliminate alcohol post-surgery.      Name: Kurt Bates  Date of Birth: 1955-07-31   Age: 62 y.o.   Gender: Male   Examiner: Galen Daft, Ph.D.  Date of Evaluation: 10/09/17    Assessments:  Clinical Interview  Mental Health Assessment Questionnaire    Purpose of Assessment:    To determine, within the limits of psychological certainty; whether Kurt Bates is comfortable with her decision to proceed with bariatric surgery; whether Kurt Bates is prepared for and committed to the required lifestyle changes, has adequate social supports, and whether Kurt Bates has sufficient information about bariatric surgery on which to base  informed consent.    Introduction:     Kurt Bates is a 62 y.o. year old male seeking psychological evaluation for weight loss surgery.  Kurt Bates's current understanding of the surgical process, pre and post-surgical requirements, behavioral changes, and long-term consequences is good.  Kurt Bates appeard to be emotionally stable and cognitively able to understand and consent to surgery.    Weight History:    Kurt Bates reported struggling with his weight for years. He reported trying multiple diet and exercise programs without achieving long term success. Kurt Bates reported feeling he has exhausted his options for independent weight loss and needs the assistance of surgery in order to improve his health, quality and and quantity of life.     Tobacco Use:  The Patient denies current tobacco use.    Recommendation for Surgery:    Kurt Bates does not appear to be experiencing any psychological distress at this time.  He is not showing any major symptoms of depression or anxiety and there are no concerns regarding his safety. He denied history of or current suicidal ideation or attempts.  No substance use or mental health issues were noted.  Scores suggest that he is likely to be compliant with the instructions provided by his health care team and has adequate information about bariatric surgery to make an informed decision.  After reviewing his profile and discussing his psychosocial history, he appears to have the tools required to handle the Bariatric surgery process; however he is encouraged to engage in supportive group activities and individual counseling, should  the need arise.    Behavioral Observations:    Kurt Bates arrived in a timely manner and was alert, oriented in all spheres with a calm affect.  He was cooperative and demonstrated a positive attitude towards improving his health outcomes.      Submitted by:    Galen Daft, Ph.D.

## 2017-10-19 ENCOUNTER — Ambulatory Visit (INDEPENDENT_AMBULATORY_CARE_PROVIDER_SITE_OTHER): Payer: BLUE CROSS/BLUE SHIELD

## 2017-10-19 ENCOUNTER — Ambulatory Visit (HOSPITAL_BASED_OUTPATIENT_CLINIC_OR_DEPARTMENT_OTHER): Payer: BLUE CROSS/BLUE SHIELD

## 2017-10-19 VITALS — Wt 331.2 lb

## 2017-10-19 DIAGNOSIS — Z7182 Exercise counseling: Secondary | ICD-10-CM

## 2017-10-19 DIAGNOSIS — Z713 Dietary counseling and surveillance: Secondary | ICD-10-CM

## 2017-10-19 DIAGNOSIS — Z719 Counseling, unspecified: Secondary | ICD-10-CM

## 2017-10-19 DIAGNOSIS — Z6841 Body Mass Index (BMI) 40.0 and over, adult: Secondary | ICD-10-CM

## 2017-10-19 NOTE — Progress Notes (Signed)
Went over strength training and aerobic training.  Prescribed 5 days a week of 30 minutes of physical activity.  Went over target heart rate zone and RPE scale to measure exercise. Talked about how many steps they should try and reach each day.   Went into detail and gave handouts on all information    Went over post op exercise guidelines and answered all questions.      Currently exercising- yes    Mode of exercise- golf and gym    Average days per week-  Gym 2-3    Plan for pre op exercise routine- Keep up current gym routine and increase more aerobic activity.Marland Kitchen

## 2017-10-22 NOTE — Progress Notes (Signed)
S:  Pt presented for 3 month class education group.  Pt had questions regarding diet, exercise and healthy live post operatively.    Previous Visits Wt:    Wt Readings from Last 10 Encounters:   10/19/17 331 lb 3.2 oz   09/11/17 322 lb   08/28/17 321 lb   05/07/17 303 lb   12/26/13 303 lb   10/08/13 299 lb       Nutrition Assessment and Diagnosis: Pt with the condition of obesity (BMI 50.36kg/m2) and co-morbidities with ongoing nutrition knowledge deficit as evidenced by initial report; active participation; and 9pound weight increasefrom previous visit. Pt received education on nutrition needs before and after weight loss surgery to include:  1. Eating out after weight loss surgery.  2. Basic nutrition education: Protein, fat, carbohydrates and nutrient dense food choices.    Importance of fiber, fluids and activity.    Nutrition Intervention: Pt to continue to self-monitor weekly and review and make changes weekly to help improve mealtime behaviors and nutrient density, improving meal pattern (not skipping). Pt reports they are going to continue to journal and self-monitor, making 2 changes/week to help them get to their health goals. Handout provided with meal planning activity completed.   P:   Pt to continue with education classes as required by insurance company. Pt provided contact information if needed.    Spent a total of 30 minutes with patient in group education counseling setting.

## 2017-11-09 ENCOUNTER — Ambulatory Visit (HOSPITAL_BASED_OUTPATIENT_CLINIC_OR_DEPARTMENT_OTHER): Payer: BLUE CROSS/BLUE SHIELD

## 2017-11-30 ENCOUNTER — Ambulatory Visit (INDEPENDENT_AMBULATORY_CARE_PROVIDER_SITE_OTHER): Payer: BLUE CROSS/BLUE SHIELD

## 2017-11-30 DIAGNOSIS — Z713 Dietary counseling and surveillance: Secondary | ICD-10-CM

## 2017-11-30 DIAGNOSIS — Z719 Counseling, unspecified: Secondary | ICD-10-CM

## 2017-11-30 NOTE — Progress Notes (Signed)
6 Month Class: Healthy Cooking and Shopping    S & O: Patient reports he has met with previous class goals.     Previous Wt:   Wt Readings from Last 10 Encounters:   11/30/17 326 lb   10/19/17 331 lb 3.2 oz   09/11/17 322 lb   08/28/17 321 lb   05/07/17 303 lb   12/26/13 303 lb   10/08/13 299 lb       A: Patient continues to demonstrate motivation to lose weight and change behaviors. Patient has mild nutrition knowledge deficit as evidenced per initial report and BMI. Patient was educated during the group session on emotions and how they affect weight balance. Discussion focused on:     1. Behavior Modification for weight loss  2. Identification of healthier cooking techniques.  3. Understanding how to shop for healthier items at the grocery store and what to look for on food packaging.    P. Return in 1 month for additional class (Emotional Eating) with completed homework.    Homework included:  Patient to include 1-2 low-fat/healthy cooking techniques to their routine and begin reading food labels regularly at the grocery store.     Educated pt for 30 minutes in a group setting.  Plan reviewed with Dr. Val Eagle

## 2017-12-04 ENCOUNTER — Ambulatory Visit (HOSPITAL_BASED_OUTPATIENT_CLINIC_OR_DEPARTMENT_OTHER): Payer: BLUE CROSS/BLUE SHIELD

## 2017-12-21 ENCOUNTER — Ambulatory Visit
Admission: RE | Admit: 2017-12-21 | Discharge: 2017-12-21 | Disposition: A | Discharge status: Home / Self Care | Payer: BLUE CROSS/BLUE SHIELD | Source: Ambulatory Visit | Attending: Surgery | Admitting: Surgery

## 2017-12-21 ENCOUNTER — Ambulatory Visit (HOSPITAL_BASED_OUTPATIENT_CLINIC_OR_DEPARTMENT_OTHER): Payer: BLUE CROSS/BLUE SHIELD | Admitting: Family

## 2017-12-21 ENCOUNTER — Other Ambulatory Visit (HOSPITAL_BASED_OUTPATIENT_CLINIC_OR_DEPARTMENT_OTHER): Payer: Self-pay | Admitting: Family

## 2017-12-21 ENCOUNTER — Encounter (HOSPITAL_BASED_OUTPATIENT_CLINIC_OR_DEPARTMENT_OTHER): Payer: Self-pay | Admitting: Family

## 2017-12-21 DIAGNOSIS — G4730 Sleep apnea, unspecified: Secondary | ICD-10-CM

## 2017-12-21 DIAGNOSIS — G4733 Obstructive sleep apnea (adult) (pediatric): Secondary | ICD-10-CM | POA: Insufficient documentation

## 2017-12-21 DIAGNOSIS — I10 Essential (primary) hypertension: Secondary | ICD-10-CM

## 2017-12-21 DIAGNOSIS — Z01818 Encounter for other preprocedural examination: Secondary | ICD-10-CM

## 2017-12-21 DIAGNOSIS — Z7189 Other specified counseling: Secondary | ICD-10-CM

## 2017-12-21 DIAGNOSIS — M170 Bilateral primary osteoarthritis of knee: Secondary | ICD-10-CM | POA: Insufficient documentation

## 2017-12-21 DIAGNOSIS — M1A9XX0 Chronic gout, unspecified, without tophus (tophi): Secondary | ICD-10-CM

## 2017-12-21 DIAGNOSIS — Z6841 Body Mass Index (BMI) 40.0 and over, adult: Secondary | ICD-10-CM

## 2017-12-21 DIAGNOSIS — K219 Gastro-esophageal reflux disease without esophagitis: Secondary | ICD-10-CM

## 2017-12-21 DIAGNOSIS — Z1321 Encounter for screening for nutritional disorder: Secondary | ICD-10-CM

## 2017-12-21 LAB — ECG 12-LEAD
Atrial Rate: 56 {beats}/min
P Axis: 76 degrees
P-R Interval: 154 ms
Q-T Interval: 434 ms
QRS Duration: 120 ms
QTC Calculation (Bezet): 418 ms
R Axis: -45 degrees
T Axis: 41 degrees
Ventricular Rate: 56 {beats}/min

## 2017-12-21 NOTE — Pre-Procedure Instructions (Signed)
EKG: Sinus Brady:LBBB Left ant fasicular block. Called surgeon office and faxed. Patient asymptomatic. No chest pain, SOB

## 2017-12-21 NOTE — Progress Notes (Signed)
Second Visit    Patient Name: Kurt Bates, Kurt Bates  Age: 62 y.o.  Sex: male   DOB: 26-Mar-1955  MRN: [id number]    Progress Note:    Kurt Bates presents today for a second visit. He is hoping to undergo Robotic Laparoscopic Roux-en-Y Gastric Bypass.All pre-operative testing was explained as well as the education requirement at [facility]). The patient was given the phone number to the bariatric scheduling coordinator at [facility] to schedule testing and education. The patient was informed of the need to obtain a medical clearance from his PCP, cardiologist, and nephrologist prior to surgery.   Medications, medical allergies, and health history were reviewed. He was instructed to avoid all anti-inflammatory medications for two weeks prior to surgery and indefinitiely thereafter. He was instructed not to take any amlodipine-valsartan-YCTZ the morning of surgery. The patent was reminded that they would need to be on a liquid diet prior to surgery. The patient was reminded that all medications larger than the head of a pencil eraser need to be crushed, chewable, or capsules that may be opened for the first eight weeks after surgery. He was diagnosed with OSA and is currently using CPAP without problems. He was instructed to bring the CPAP machine to the hospital the day of surgery. He was instructed to arrive at the hospital on the day of surgery two hours prior to surgery.  All of his questions were answered.     Past Medical History:     Past Medical History:   Diagnosis Date   . Gout    . Hypertension    . Sleep apnea     CPAP       Past Surgical History:     Past Surgical History:   Procedure Laterality Date   . APPENDECTOMY     . EGD, COLONOSCOPY N/A 05/07/2017    Procedure: EGD, COLONOSCOPY;  Surgeon: Annalee Genta, MD;  Location: [facility];  Service: Gastroenterology;  Laterality: N/A;  EGD, COLONOSCOPY  Q1=UNK   . REPLACEMENT TOTAL KNEE BILATERAL         Family History:     Family  History   Problem Relation Age of Onset   . Cancer Father        Allergies:   No Known Allergies    Medications:     Prior to Admission medications    Medication Sig Start Date End Date Taking? Authorizing Provider   amLODIPine-Valsartan-HCTZ 5-160-12.5 MG Tab Take 1 tablet by mouth daily 08/16/17  Yes [provider]       Review of Systems:     Constitutional: negative for fevers, night sweats  Head and neck: denies blurry vision, dry mouth or hearing impairment  Respiratory: negative for SOB, cough  Cardiovascular: negative for chest pain, palpitations  Gastrointestinal: negative for abdominal pain or bloating  Genitourinary: negative for hematuria, dysuria  Musculoskeletal: negative for bone pain, myalgias and stiff joints  Neurological: negative for dizziness, gait problems, headaches and memory problems  Behavioral/Psych: negative for fatigue, loss of interest in favorite activities, sleep disturbance  Endocrine: negative for temperature intolerance    Physical Exam:     Vitals:    12/21/17 0900   BP: 119/79   Pulse: 79   Temp: 98 F (36.7 C)     Wt Readings from Last 10 Encounters:   12/21/17 329 lb   11/30/17 326 lb   10/19/17 331 lb 3.2 oz   09/11/17 322 lb  08/28/17 321 lb   05/07/17 303 lb   12/26/13 303 lb   10/08/13 299 lb       Appearance: comfortable, no acute distress, well nourished  HEENT: normocephalic, clear conjunctiva   Musculoskeletal: grossly intact ROM and motor strength, no obvious deformities  Extremities: no edema, no calf pain  Skin: no rash or ecchymosis   Neuro: alert and oriented x 3, able to move all extremities, gait steady  Psych: calm and cooperative     Assessment:   Pre-operative for Laparoscopic Robotic Assisted Laparoscopic Roux-en-Y Gastric Bypass.    Plan:   Complete all pre-operative requirements prior to upcoming visit with surgeon.    Liquid diet prior to surgery.   Return to the office for nutritional counseling and education as well as for a final pre-operative  visit with Dr. Jeanell Sparrow.     Signed by: Lonna Cobb, FNP-BC    This note was generated by the [facility] EMR system/Dragon speech recognition and may contain inherent errors or omissions not intended by the user. Grammatical errors, random word insertions, deletions, pronoun errors and incomplete sentences are occasional consequences of this technology due to software limitations. Not all errors are caught or corrected. If there are questions or concerns about the content of this note or information contained within the body of this dictation they should be addressed directly with the author for clarification.

## 2017-12-23 LAB — ECG 12-LEAD
Atrial Rate: 63 {beats}/min
P Axis: 78 degrees
P-R Interval: 156 ms
Q-T Interval: 428 ms
QRS Duration: 116 ms
QTC Calculation (Bezet): 437 ms
R Axis: -49 degrees
T Axis: 41 degrees
Ventricular Rate: 63 {beats}/min

## 2017-12-27 ENCOUNTER — Ambulatory Visit
Admission: RE | Admit: 2017-12-27 | Discharge: 2017-12-27 | Disposition: A | Discharge status: Home / Self Care | Payer: BLUE CROSS/BLUE SHIELD | Source: Ambulatory Visit | Attending: Surgery | Admitting: Surgery

## 2017-12-27 ENCOUNTER — Ambulatory Visit (INDEPENDENT_AMBULATORY_CARE_PROVIDER_SITE_OTHER): Payer: BLUE CROSS/BLUE SHIELD

## 2017-12-27 DIAGNOSIS — M170 Bilateral primary osteoarthritis of knee: Secondary | ICD-10-CM | POA: Insufficient documentation

## 2017-12-27 DIAGNOSIS — I10 Essential (primary) hypertension: Secondary | ICD-10-CM | POA: Insufficient documentation

## 2017-12-27 DIAGNOSIS — N281 Cyst of kidney, acquired: Secondary | ICD-10-CM | POA: Insufficient documentation

## 2017-12-27 DIAGNOSIS — M1A9XX0 Chronic gout, unspecified, without tophus (tophi): Secondary | ICD-10-CM | POA: Insufficient documentation

## 2017-12-27 DIAGNOSIS — Z713 Dietary counseling and surveillance: Secondary | ICD-10-CM

## 2017-12-27 DIAGNOSIS — G4733 Obstructive sleep apnea (adult) (pediatric): Secondary | ICD-10-CM

## 2017-12-27 DIAGNOSIS — Z719 Counseling, unspecified: Secondary | ICD-10-CM

## 2017-12-27 DIAGNOSIS — Z01818 Encounter for other preprocedural examination: Secondary | ICD-10-CM | POA: Insufficient documentation

## 2017-12-31 NOTE — Progress Notes (Signed)
S:  Pt presents with questions about post op diet phases, supplements and lifestyle after weight loss surgery.  Pt presents for 2 hour pre operative education class.    A:  Pt has nutrition knowledge deficit regarding pre op and post op nutrition guidelines for weight loss surgery.  Pt has been educated on the following topics:  Anatomy review.  Necessary behavior/eating modifications to avoid complications.  Post op liquid diet phase  Post op mushy/soft foods diet phase  Vitamin/mineral supplementation and consequences of non compliance.  Reviewed symptoms of deficiencies.  Protein supplementation - requirements of protein supplements, amounts needed per surgery/pt, and consequences of non compliance.  Review of the nutrition fact panel, what to look for.  Review of dumping syndrome and it's effects in addition to trigger foods.  Review of possible post operative complications, tips/techniques to manage them.  Review of appropriate foods and meal plans for each diet phase.  Quick review of physical activity and the guidelines pt needs to follow post operatively.    P:  1.  Pt to follow up with MD and RD for final pre op visit.    2.  Contact information provided.  Pt to contact PRN.    Spent a total of 90 minutes educating pt in a group setting.  Plan reviewed with Dr. Moazzez

## 2018-01-11 ENCOUNTER — Ambulatory Visit (INDEPENDENT_AMBULATORY_CARE_PROVIDER_SITE_OTHER): Payer: BLUE CROSS/BLUE SHIELD

## 2018-01-11 ENCOUNTER — Encounter (HOSPITAL_BASED_OUTPATIENT_CLINIC_OR_DEPARTMENT_OTHER): Payer: Self-pay | Admitting: Surgery

## 2018-01-11 ENCOUNTER — Encounter (HOSPITAL_BASED_OUTPATIENT_CLINIC_OR_DEPARTMENT_OTHER): Payer: Self-pay

## 2018-01-11 ENCOUNTER — Ambulatory Visit (INDEPENDENT_AMBULATORY_CARE_PROVIDER_SITE_OTHER): Payer: BLUE CROSS/BLUE SHIELD | Admitting: Surgery

## 2018-01-11 DIAGNOSIS — M170 Bilateral primary osteoarthritis of knee: Secondary | ICD-10-CM

## 2018-01-11 DIAGNOSIS — G4733 Obstructive sleep apnea (adult) (pediatric): Secondary | ICD-10-CM

## 2018-01-11 DIAGNOSIS — Z713 Dietary counseling and surveillance: Secondary | ICD-10-CM

## 2018-01-11 DIAGNOSIS — K219 Gastro-esophageal reflux disease without esophagitis: Secondary | ICD-10-CM

## 2018-01-11 DIAGNOSIS — I10 Essential (primary) hypertension: Secondary | ICD-10-CM

## 2018-01-11 DIAGNOSIS — K449 Diaphragmatic hernia without obstruction or gangrene: Secondary | ICD-10-CM

## 2018-01-11 DIAGNOSIS — Z6841 Body Mass Index (BMI) 40.0 and over, adult: Secondary | ICD-10-CM

## 2018-01-11 DIAGNOSIS — M1A9XX0 Chronic gout, unspecified, without tophus (tophi): Secondary | ICD-10-CM

## 2018-01-11 DIAGNOSIS — Z719 Counseling, unspecified: Secondary | ICD-10-CM

## 2018-01-11 NOTE — Progress Notes (Signed)
Assessment:  Morbid obesity  BMI 48  Hypertension  Osteoarthritis bilateral knees  Obstructive sleep apnea  Hiatal hernia  GERD        Plan:  This is a 63 y.o. year old morbidly obese male who has failed multiple previous attempts at conservative weight loss and has opted to proceed with robotic/laparoscopic RNY-Gastric Bypass surgery with hiatal hernia repair.  All questions and concerns were addressed.  The risks, benefits and alternate options were discussed with in detail.  The risks include but are not limited to bleeding, infection, sepsis, shock, wound infection, wound herniation, DVT, PE, death, as well as unforeseen conditions such as  inadequate weight loss, excessive weight loss, weight regain, anastomotic leaks, anastomotic ulcers, anastomotic strictures.  Patient wishes to proceed with above mentioned plan and surgery at this time.        History of Present Illness:  The patient is a pleasant 63 y.o. year old morbidly obese male  who has failed multiple previous attempts at conservative weight loss. She has opted to proceed with robotic/laparoscopic RNY-Gastric Bypass as a surgical weight loss option.  All other options were discussed in detail. The patient has undergone an extensive preoperative education and medical clearance along with dietary and psychiatric counseling.      Bariatric comorbidities present: hypertension, GERD and obstructive sleep apnea  The following portions of the patient's history were reviewed and updated as appropriate: allergies, current medications, past family history, past medical history, past social history, past surgical history and problem list.  CURRENT PROBLEM LIST:   Patient Active Problem List   Diagnosis    Chronic gout without tophus, unspecified cause, unspecified site    Essential hypertension    H/O knee surgery    Osteoarthritis of both knees, unspecified osteoarthritis type    Keloid    Hx of colonic polyps    Morbid obesity with BMI of 45.0-49.9,  adult    Sleep apnea    Gastroesophageal reflux disease without esophagitis    Hiatal hernia     PAST MEDICAL HISTORY:   Past Medical History:   Diagnosis Date    Gout     Hypertension     Sleep apnea     CPAP     PAST SURGICAL HISTORY:   Past Surgical History:   Procedure Laterality Date    APPENDECTOMY      EGD, COLONOSCOPY N/A 05/07/2017    Procedure: EGD, COLONOSCOPY;  Surgeon: Annalee Genta, MD;  Location: [facility];  Service: Gastroenterology;  Laterality: N/A;  EGD, COLONOSCOPY  Q1=UNK    REPLACEMENT TOTAL KNEE BILATERAL       FAMILY HISTORY:    Family History   Problem Relation Age of Onset    Cancer Father      SOCIAL HISTORY:   Social History     Socioeconomic History    Marital status: Married     Spouse name: None    Number of children: None    Years of education: None    Highest education level: None   Occupational History    None   Social Engineer, site strain: None    Food insecurity:     Worry: None     Inability: None    Transportation needs:     Medical: None     Non-medical: None   Tobacco Use    Smoking status: Never Smoker    Smokeless tobacco: Never Used   Substance and Sexual Activity  Alcohol use: Yes     Alcohol/week: 3.0 standard drinks     Types: 2 Standard drinks or equivalent, 1 Shots of liquor per week    Drug use: No    Sexual activity: Yes     Partners: Female   Lifestyle    Physical activity:     Days per week: None     Minutes per session: None    Stress: None   Relationships    Social connections:     Talks on phone: None     Gets together: None     Attends religious service: None     Active member of club or organization: None     Attends meetings of clubs or organizations: None     Relationship status: None    Intimate partner violence:     Fear of current or ex partner: None     Emotionally abused: None     Physically abused: None     Forced sexual activity: None   Other Topics Concern    Dietary supplements / vitamins No     Anesthesia problems No    Blood thinners No    Future Children No    Number of children Yes     Comment: 3    Eats large amounts Yes    Excessive Sweets Yes    Skips meals No    Eats excessive starches Yes    Snacks or grazes Yes    Emotional eater Yes    Eats fried food Yes    Eats fast food Yes    Diet Center No    HMR No    Doylene Bode No    LA Weight Loss No    Nutri-System No    Opti-Fast / Medi-Fast No    Overeaters Anonymous No    [facility] No    TOPS No    Weight Watchers No    Atkins No    Binging / Purging No    Body for Life No    Cabbage Soup No    Calorie Counting No    Fasting No    Berline Chough No    Health Spa No    Herbal Life No    High Protein No    Low Carb No    Low Fat No    [facility] Diet No    [place] Diet No    Richard Simmons Diet No    [place] Diet No    Slim Fast No    [place] No    Sugar Busters No    Vomiting No    Zone Diet No    Stationary cycle or treadmill Yes    Gym/fitness Classes No    Home exercise/video No    Swimming No    Team sports Yes    Weight training Yes    Walking or running No    Hospitalization No    Hypnosis No    Physical therapy No    Psychological therapy No    Residential program No    Acutrim No    Amphetamines No    Anorex No    Byetta No    Dexatrim No    Didrex No    Fastin No    Fen - Phen No    Ionamin / Adipex No    Mazanor No    Meridia No    Obalan No    Phendiet No  Phentrol No    Phenteramine No    Plegine No    Pondimin No    Qsymia No    Prozac No    Redux No    Sanorex No    Tenuate No    Tepanole No    Wechless No    Wellbutrin No    Xenical (Orlistat, Alli) No    Other Med No    No impairment No    Walks with cane/crutch No    Requires a wheelchair No    Bedridden No    Are you currently being treated for depression? No    Do you snore? Yes    Are you receiving any medical or psychological services? No    Do you ever wake up  at night gasping for breath? No    Do you have or have you been treated for an eating disorder? No    Anyone ever told you that you stop breathing while asleep? No    Do you exercise regularly? No    Have you or family member ever have trouble with anesthesia? No   Social History Narrative    None    (Does not refresh)  TOBACCO HISTORY:   Social History     Tobacco Use   Smoking Status Never Smoker   Smokeless Tobacco Never Used     ALCOHOL HISTORY:   Social History     Substance and Sexual Activity   Alcohol Use Yes    Alcohol/week: 3.0 standard drinks    Types: 2 Standard drinks or equivalent, 1 Shots of liquor per week     DRUG HISTORY:   Social History     Substance and Sexual Activity   Drug Use No     CURRENT HOSPITAL MEDICATIONS:   Current Outpatient Medications   Medication Sig Dispense Refill    amLODIPine-Valsartan-HCTZ 5-160-12.5 MG Tab Take 1 tablet by mouth daily  5     No current facility-administered medications for this visit.      CURRENT OUTPATIENT MEDICATIONS:   Outpatient Medications Marked as Taking for the 01/11/18 encounter (Clinical Support) with Enjoli Tidd R, DO   Medication Sig Dispense Refill    amLODIPine-Valsartan-HCTZ 5-160-12.5 MG Tab Take 1 tablet by mouth daily  5     ALLERGIES: No Known Allergies     Review of Systems  Constitutional: negative for fevers, night sweats  Respiratory: negative for SOB, cough  Cardiovascular: negative for chest pain, palpitations  Gastrointestinal: negative for nausea vomiting  Genitourinary:negative for hematuria, dysuria  Musculoskeletal:negative for bone pain, myalgias and stiff joints  Neurological: negative for dizziness, gait problems, headaches and memory problems  Behavioral/Psych: negative for fatigue, loss of interest in favorite activities, separation anxiety and sleep disturbance  Endocrine: negative for temperature intolerance  Integumentary: No rashes, no skin infections    Objective:    BP 114/78    Pulse 85    Temp 98 F  (36.7 C) (Oral)    Ht 5\' 8"     Wt 321 lb    BMI 48.81 kg/m   Body mass index is 48.81 kg/m.  Weight: 321 lb       General Appearance:    Alert, cooperative, no distress, obese   Head:    Normocephalic, without obvious abnormality, atraumatic   Eyes:    No scleral icterus            Throat:   Lips, mucosa, and tongue normal; teeth and gums normal  Neck:   Supple, symmetrical, trachea midline, no adenopathy;        thyroid:  No enlargement/tenderness/nodules; no carotid    bruit or JVD   Back:     Symmetric, no curvature, ROM normal, no CVA tenderness   Lungs:     Clear to auscultation bilaterally, respirations unlabored   Chest wall:    No tenderness or deformity   Heart:    Regular rate and rhythm, S1 and S2 normal, no murmur, rub   or gallop   Abdomen:     Soft, non-tender, bowel sounds active all four quadrants,     no masses, no organomegaly   Extremities:   Extremities normal, atraumatic, no cyanosis or edema   Pulses:   2+ and symmetric all extremities   Skin:   Skin color, texture, turgor normal, no rashes or lesions   Lymph nodes:   Cervical, supraclavicular, and axillary nodes normal   Neurologic:   Alert and oriented x 3.  Able to move all extremities.       Data Review:     Hospital Outpatient Visit on 12/21/2017   Component Date Value Ref Range Status    Ventricular Rate 12/21/2017 63  BPM Final    Atrial Rate 12/21/2017 63  BPM Final    P-R Interval 12/21/2017 156  ms Final    QRS Duration 12/21/2017 116  ms Final    Q-T Interval 12/21/2017 428  ms Final    QTC Calculation (Bezet) 12/21/2017 437  ms Final    P Axis 12/21/2017 78  degrees Final    R Axis 12/21/2017 -49  degrees Final    T Axis 12/21/2017 41  degrees Final    Ventricular Rate 12/21/2017 56  BPM Preliminary    Atrial Rate 12/21/2017 56  BPM Preliminary    P-R Interval 12/21/2017 154  ms Preliminary    QRS Duration 12/21/2017 120  ms Preliminary    Q-T Interval 12/21/2017 434  ms Preliminary    QTC Calculation (Bezet)  12/21/2017 418  ms Preliminary    P Axis 12/21/2017 76  degrees Preliminary    R Axis 12/21/2017 -45  degrees Preliminary    T Axis 12/21/2017 41  degrees Preliminary     .  Radiology Results for the past 90 days.   @[email or web address] @      Elea Holtzclaw R. Aarsh Fristoe, DO, FACS, FASMBS, FACOS

## 2018-01-11 NOTE — Progress Notes (Signed)
S:  Pt is preparing for bariatric bypass surgery.  Pt presents for pre-operative review appointment and has questions about post-operative diet stages and vitamin/mineral requirements.  Pt states no hx of nutritional deficiencies.  Pt reports currently taking Rx Vitamin D supplements at this time.  Pt has started liquid diet at this time.  He's been on the diet for 12 days.  He reports no issues w N/V/C/D at this time.  Current weight loss:  10 pounds.    O:    Today's Wt:  321 lb     Previous Wt:    Wt Readings from Last 10 Encounters:   01/11/18 321 lb   12/21/17 329 lb   11/30/17 326 lb   10/19/17 331 lb 3.2 oz   09/11/17 322 lb   08/28/17 321 lb   05/07/17 303 lb   12/26/13 303 lb   10/08/13 299 lb     Pertinent lab values:  No labs at this time.    A:  Answered pt questions and pt verbalized understanding and desired compliance with post-operative diet.    P:  1.  Pt to start on post operative 3 week liquid diet upon discharge from hospital and continue until directed by RD/physician at first post-operative in office appointment.  2.  Pt to start the following chewable or liquid vitamins/minerals upon hospital discharge:   A.  Complete Adult Multivitamin and Mineral:  1 tablet BID   B.  500 mg Calcium Citrate, 1 tablet TID   C.  1,000 mcg Vitamin B12 sublingually, q day.   D.  Vitamin B 50 Complex, 1 tablet/serving q day.   E.  Iron, 29 mg q day.   F.  Vitamin D3, Rx q week.  Pt to start pre op.  3.  Pt advised to call with any questions - contact information given.  F/u in 10-14 post operatively or PRN.    Spent a total of 15 minutes educating pt in a individual one-on-one setting.  Plan reviewed with Dr. Jeanell Sparrow

## 2018-01-11 NOTE — H&P (View-Only) (Signed)
Assessment:  Morbid obesity  BMI 48  Hypertension  Osteoarthritis bilateral knees  Obstructive sleep apnea  Hiatal hernia  GERD        Plan:  This is a 63 y.o. year old morbidly obese male who has failed multiple previous attempts at conservative weight loss and has opted to proceed with robotic/laparoscopic RNY-Gastric Bypass surgery with hiatal hernia repair.  All questions and concerns were addressed.  The risks, benefits and alternate options were discussed with in detail.  The risks include but are not limited to bleeding, infection, sepsis, shock, wound infection, wound herniation, DVT, PE, death, as well as unforeseen conditions such as  inadequate weight loss, excessive weight loss, weight regain, anastomotic leaks, anastomotic ulcers, anastomotic strictures.  Patient wishes to proceed with above mentioned plan and surgery at this time.        History of Present Illness:  The patient is a pleasant 63 y.o. year old morbidly obese male  who has failed multiple previous attempts at conservative weight loss. She has opted to proceed with robotic/laparoscopic RNY-Gastric Bypass as a surgical weight loss option.  All other options were discussed in detail. The patient has undergone an extensive preoperative education and medical clearance along with dietary and psychiatric counseling.      Bariatric comorbidities present: hypertension, GERD and obstructive sleep apnea  The following portions of the patient's history were reviewed and updated as appropriate: allergies, current medications, past family history, past medical history, past social history, past surgical history and problem list.  CURRENT PROBLEM LIST:   Patient Active Problem List   Diagnosis   • Chronic gout without tophus, unspecified cause, unspecified site   • Essential hypertension   • H/O knee surgery   • Osteoarthritis of both knees, unspecified osteoarthritis type   • Keloid   • Hx of colonic polyps   • Morbid obesity with BMI of 45.0-49.9,  adult   • Sleep apnea   • Gastroesophageal reflux disease without esophagitis   • Hiatal hernia     PAST MEDICAL HISTORY:   Past Medical History:   Diagnosis Date   • Gout    • Hypertension    • Sleep apnea     CPAP     PAST SURGICAL HISTORY:   Past Surgical History:   Procedure Laterality Date   • APPENDECTOMY     • EGD, COLONOSCOPY N/A 05/07/2017    Procedure: EGD, COLONOSCOPY;  Surgeon: Harnden, Ivan Peter, MD;  Location: [facility];  Service: Gastroenterology;  Laterality: N/A;  EGD, COLONOSCOPY  Q1=UNK   • REPLACEMENT TOTAL KNEE BILATERAL       FAMILY HISTORY:    Family History   Problem Relation Age of Onset   • Cancer Father      SOCIAL HISTORY:   Social History     Socioeconomic History   • Marital status: Married     Spouse name: None   • Number of children: None   • Years of education: None   • Highest education level: None   Occupational History   • None   Social Needs   • Financial resource strain: None   • Food insecurity:     Worry: None     Inability: None   • Transportation needs:     Medical: None     Non-medical: None   Tobacco Use   • Smoking status: Never Smoker   • Smokeless tobacco: Never Used   Substance and Sexual Activity   •   Alcohol use: Yes     Alcohol/week: 3.0 standard drinks     Types: 2 Standard drinks or equivalent, 1 Shots of liquor per week   • Drug use: No   • Sexual activity: Yes     Partners: Female   Lifestyle   • Physical activity:     Days per week: None     Minutes per session: None   • Stress: None   Relationships   • Social connections:     Talks on phone: None     Gets together: None     Attends religious service: None     Active member of club or organization: None     Attends meetings of clubs or organizations: None     Relationship status: None   • Intimate partner violence:     Fear of current or ex partner: None     Emotionally abused: None     Physically abused: None     Forced sexual activity: None   Other Topics Concern   • Dietary supplements / vitamins No   •  Anesthesia problems No   • Blood thinners No   • Future Children No   • Number of children Yes     Comment: 3   • Eats large amounts Yes   • Excessive Sweets Yes   • Skips meals No   • Eats excessive starches Yes   • Snacks or grazes Yes   • Emotional eater Yes   • Eats fried food Yes   • Eats fast food Yes   • Diet Center No   • HMR No   • Jenny Craig No   • LA Weight Loss No   • Nutri-System No   • Opti-Fast / Medi-Fast No   • Overeaters Anonymous No   • [facility] No   • TOPS No   • Weight Watchers No   • Atkins No   • Binging / Purging No   • Body for Life No   • Cabbage Soup No   • Calorie Counting No   • Fasting No   • Gloria Marshall No   • Health Spa No   • Herbal Life No   • High Protein No   • Low Carb No   • Low Fat No   • [facility] Diet No   • [place] Diet No   • Richard Simmons Diet No   • [place] Diet No   • Slim Fast No   • [place] No   • Sugar Busters No   • Vomiting No   • Zone Diet No   • Stationary cycle or treadmill Yes   • Gym/fitness Classes No   • Home exercise/video No   • Swimming No   • Team sports Yes   • Weight training Yes   • Walking or running No   • Hospitalization No   • Hypnosis No   • Physical therapy No   • Psychological therapy No   • Residential program No   • Acutrim No   • Amphetamines No   • Anorex No   • Byetta No   • Dexatrim No   • Didrex No   • Fastin No   • Fen - Phen No   • Ionamin / Adipex No   • Mazanor No   • Meridia No   • Obalan No   • Phendiet No   •   Phentrol No   • Phenteramine No   • Plegine No   • Pondimin No   • Qsymia No   • Prozac No   • Redux No   • Sanorex No   • Tenuate No   • Tepanole No   • Wechless No   • Wellbutrin No   • Xenical (Orlistat, Alli) No   • Other Med No   • No impairment No   • Walks with cane/crutch No   • Requires a wheelchair No   • Bedridden No   • Are you currently being treated for depression? No   • Do you snore? Yes   • Are you receiving any medical or psychological services? No   • Do you ever wake up  at night gasping for breath? No   • Do you have or have you been treated for an eating disorder? No   • Anyone ever told you that you stop breathing while asleep? No   • Do you exercise regularly? No   • Have you or family member ever have trouble with anesthesia? No   Social History Narrative   • None    (Does not refresh)  TOBACCO HISTORY:   Social History     Tobacco Use   Smoking Status Never Smoker   Smokeless Tobacco Never Used     ALCOHOL HISTORY:   Social History     Substance and Sexual Activity   Alcohol Use Yes   • Alcohol/week: 3.0 standard drinks   • Types: 2 Standard drinks or equivalent, 1 Shots of liquor per week     DRUG HISTORY:   Social History     Substance and Sexual Activity   Drug Use No     CURRENT HOSPITAL MEDICATIONS:   Current Outpatient Medications   Medication Sig Dispense Refill   • amLODIPine-Valsartan-HCTZ 5-160-12.5 MG Tab Take 1 tablet by mouth daily  5     No current facility-administered medications for this visit.      CURRENT OUTPATIENT MEDICATIONS:   Outpatient Medications Marked as Taking for the 01/11/18 encounter (Clinical Support) with Tremaine Earwood R, DO   Medication Sig Dispense Refill   • amLODIPine-Valsartan-HCTZ 5-160-12.5 MG Tab Take 1 tablet by mouth daily  5     ALLERGIES: No Known Allergies     Review of Systems  Constitutional: negative for fevers, night sweats  Respiratory: negative for SOB, cough  Cardiovascular: negative for chest pain, palpitations  Gastrointestinal: negative for nausea vomiting  Genitourinary:negative for hematuria, dysuria  Musculoskeletal:negative for bone pain, myalgias and stiff joints  Neurological: negative for dizziness, gait problems, headaches and memory problems  Behavioral/Psych: negative for fatigue, loss of interest in favorite activities, separation anxiety and sleep disturbance  Endocrine: negative for temperature intolerance  Integumentary: No rashes, no skin infections    Objective:    BP 114/78    Pulse 85    Temp 98 °F  (36.7 °C) (Oral)    Ht 5' 8"    Wt 321 lb    BMI 48.81 kg/m²   Body mass index is 48.81 kg/m².  Weight: 321 lb       General Appearance:    Alert, cooperative, no distress, obese   Head:    Normocephalic, without obvious abnormality, atraumatic   Eyes:    No scleral icterus            Throat:   Lips, mucosa, and tongue normal; teeth and gums normal     Neck:   Supple, symmetrical, trachea midline, no adenopathy;        thyroid:  No enlargement/tenderness/nodules; no carotid    bruit or JVD   Back:     Symmetric, no curvature, ROM normal, no CVA tenderness   Lungs:     Clear to auscultation bilaterally, respirations unlabored   Chest wall:    No tenderness or deformity   Heart:    Regular rate and rhythm, S1 and S2 normal, no murmur, rub   or gallop   Abdomen:     Soft, non-tender, bowel sounds active all four quadrants,     no masses, no organomegaly   Extremities:   Extremities normal, atraumatic, no cyanosis or edema   Pulses:   2+ and symmetric all extremities   Skin:   Skin color, texture, turgor normal, no rashes or lesions   Lymph nodes:   Cervical, supraclavicular, and axillary nodes normal   Neurologic:   Alert and oriented x 3.  Able to move all extremities.       Data Review:     Hospital Outpatient Visit on 12/21/2017   Component Date Value Ref Range Status   • Ventricular Rate 12/21/2017 63  BPM Final   • Atrial Rate 12/21/2017 63  BPM Final   • P-R Interval 12/21/2017 156  ms Final   • QRS Duration 12/21/2017 116  ms Final   • Q-T Interval 12/21/2017 428  ms Final   • QTC Calculation (Bezet) 12/21/2017 437  ms Final   • P Axis 12/21/2017 78  degrees Final   • R Axis 12/21/2017 -49  degrees Final   • T Axis 12/21/2017 41  degrees Final   • Ventricular Rate 12/21/2017 56  BPM Preliminary   • Atrial Rate 12/21/2017 56  BPM Preliminary   • P-R Interval 12/21/2017 154  ms Preliminary   • QRS Duration 12/21/2017 120  ms Preliminary   • Q-T Interval 12/21/2017 434  ms Preliminary   • QTC Calculation (Bezet)  12/21/2017 418  ms Preliminary   • P Axis 12/21/2017 76  degrees Preliminary   • R Axis 12/21/2017 -45  degrees Preliminary   • T Axis 12/21/2017 41  degrees Preliminary     .  Radiology Results for the past 90 days.   @[email or web address]@      Lucciana Head R. Sabre Romberger, DO, FACS, FASMBS, FACOS

## 2018-01-14 ENCOUNTER — Encounter
Admission: AD | Disposition: A | Discharge status: Home / Self Care | Payer: Self-pay | Source: Ambulatory Visit | Attending: Surgery

## 2018-01-14 ENCOUNTER — Inpatient Hospital Stay: Payer: BLUE CROSS/BLUE SHIELD | Admitting: Certified Registered"

## 2018-01-14 ENCOUNTER — Inpatient Hospital Stay
Admission: AD | Admit: 2018-01-14 | Discharge: 2018-01-15 | DRG: 621 | Disposition: A | Discharge status: Home / Self Care | Payer: BLUE CROSS/BLUE SHIELD | Source: Ambulatory Visit | Attending: Surgery | Admitting: Surgery

## 2018-01-14 DIAGNOSIS — G4730 Sleep apnea, unspecified: Secondary | ICD-10-CM | POA: Diagnosis present

## 2018-01-14 DIAGNOSIS — K219 Gastro-esophageal reflux disease without esophagitis: Secondary | ICD-10-CM | POA: Diagnosis present

## 2018-01-14 DIAGNOSIS — Z96653 Presence of artificial knee joint, bilateral: Secondary | ICD-10-CM | POA: Diagnosis present

## 2018-01-14 DIAGNOSIS — K449 Diaphragmatic hernia without obstruction or gangrene: Secondary | ICD-10-CM | POA: Diagnosis present

## 2018-01-14 DIAGNOSIS — G4733 Obstructive sleep apnea (adult) (pediatric): Secondary | ICD-10-CM | POA: Diagnosis present

## 2018-01-14 DIAGNOSIS — Z6841 Body Mass Index (BMI) 40.0 and over, adult: Secondary | ICD-10-CM

## 2018-01-14 DIAGNOSIS — M1A9XX0 Chronic gout, unspecified, without tophus (tophi): Secondary | ICD-10-CM | POA: Diagnosis present

## 2018-01-14 DIAGNOSIS — M170 Bilateral primary osteoarthritis of knee: Secondary | ICD-10-CM

## 2018-01-14 DIAGNOSIS — G8918 Other acute postprocedural pain: Secondary | ICD-10-CM | POA: Diagnosis not present

## 2018-01-14 DIAGNOSIS — Z9884 Bariatric surgery status: Secondary | ICD-10-CM

## 2018-01-14 DIAGNOSIS — I10 Essential (primary) hypertension: Secondary | ICD-10-CM | POA: Diagnosis present

## 2018-01-14 HISTORY — PX: ROBOT XI ASSISTED,LAPAROSCOPIC,HERNIA REPAIR,HIATAL: SHX6026

## 2018-01-14 HISTORY — PX: INSERTION, PAIN PUMP (MEDICAL): SHX4385

## 2018-01-14 HISTORY — PX: ROBOT XI ASSISTED, LAPAROCOPIC, GASTRIC BYPASS: SHX9050

## 2018-01-14 LAB — TYPE AND SCREEN
AB Screen Gel: NEGATIVE
ABO Rh: A POS

## 2018-01-14 LAB — GLUCOSE WHOLE BLOOD - POCT: Whole Blood Glucose POCT: 84 mg/dL (ref 70–100)

## 2018-01-14 SURGERY — ROBOT XI ASSISTED, LAPAROSCOPIC, GASTRIC BYPASS
Anesthesia: Anesthesia General | Site: Abdomen | Wound class: Clean Contaminated

## 2018-01-14 MED ORDER — SCOPOLAMINE 1 MG/3DAYS TD PT72
1.00 | MEDICATED_PATCH | TRANSDERMAL | Status: DC | PRN
Start: 2018-01-14 — End: 2018-01-15

## 2018-01-14 MED ORDER — KETAMINE HCL 50 MG/ML IJ SOLN
INTRAMUSCULAR | Status: AC
Start: 2018-01-14 — End: ?
  Filled 2018-01-14: qty 10

## 2018-01-14 MED ORDER — SIMETHICONE 80 MG PO CHEW
80.0000 mg | CHEWABLE_TABLET | Freq: Four times a day (QID) | ORAL | Status: DC | PRN
Start: 2018-01-15 — End: 2018-01-15

## 2018-01-14 MED ORDER — SODIUM CHLORIDE 0.9 % IV SOLN
INTRAVENOUS | Status: DC | PRN
Start: 2018-01-14 — End: 2018-01-14
  Administered 2018-01-14: 17:00:00 0.4 ug/kg/h via INTRAVENOUS

## 2018-01-14 MED ORDER — FAMOTIDINE 10 MG/ML IV SOLN (WRAP)
20.0000 mg | Freq: Two times a day (BID) | INTRAVENOUS | Status: DC
Start: 2018-01-14 — End: 2018-01-15
  Administered 2018-01-14 – 2018-01-15 (×2): 20 mg via INTRAVENOUS
  Filled 2018-01-14 (×2): qty 2

## 2018-01-14 MED ORDER — SUCCINYLCHOLINE CHLORIDE 20 MG/ML IJ SOLN
INTRAMUSCULAR | Status: DC | PRN
Start: 2018-01-14 — End: 2018-01-14
  Administered 2018-01-14: 140 mg via INTRAVENOUS

## 2018-01-14 MED ORDER — LIDOCAINE HCL 2 % IJ SOLN
INTRAMUSCULAR | Status: DC | PRN
Start: 2018-01-14 — End: 2018-01-14
  Administered 2018-01-14: 40 mg

## 2018-01-14 MED ORDER — KETAMINE HCL 50 MG/ML IJ SOLN
INTRAMUSCULAR | Status: DC | PRN
Start: 2018-01-14 — End: 2018-01-14
  Administered 2018-01-14: 50 mg via INTRAVENOUS

## 2018-01-14 MED ORDER — ONDANSETRON HCL 4 MG/2ML IJ SOLN
INTRAMUSCULAR | Status: DC | PRN
Start: 2018-01-14 — End: 2018-01-14
  Administered 2018-01-14: 4 mg via INTRAVENOUS

## 2018-01-14 MED ORDER — CEFAZOLIN SODIUM-DEXTROSE 2-5 GM/50ML-% IV SOLN
INTRAVENOUS | Status: AC
Start: 2018-01-14 — End: ?
  Filled 2018-01-14: qty 50

## 2018-01-14 MED ORDER — BUPIVACAINE HCL (PF) 0.5 % IJ SOLN
INTRAMUSCULAR | Status: AC
Start: 2018-01-14 — End: ?
  Filled 2018-01-14: qty 60

## 2018-01-14 MED ORDER — NEOSTIGMINE METHYLSULFATE 1 MG/ML IJ/IV SOLN (WRAP)
Status: DC | PRN
Start: 2018-01-14 — End: 2018-01-14
  Administered 2018-01-14: 5 mg via INTRAVENOUS

## 2018-01-14 MED ORDER — ONDANSETRON HCL 4 MG/2ML IJ SOLN
4.0000 mg | Freq: Three times a day (TID) | INTRAMUSCULAR | Status: DC | PRN
Start: 2018-01-14 — End: 2018-01-15

## 2018-01-14 MED ORDER — LACTATED RINGERS IV BOLUS
500.0000 mL | Freq: Once | INTRAVENOUS | Status: AC
Start: 2018-01-14 — End: 2018-01-14
  Administered 2018-01-14: 15:00:00 500 mL via INTRAVENOUS

## 2018-01-14 MED ORDER — BUPIVACAINE-EPINEPHRINE (PF) 0.5% -1:200000 IJ SOLN
INTRAMUSCULAR | Status: DC | PRN
Start: 2018-01-14 — End: 2018-01-14
  Administered 2018-01-14: 60 mL

## 2018-01-14 MED ORDER — SODIUM CHLORIDE 0.9 % IR SOLN
Status: DC | PRN
Start: 2018-01-14 — End: 2018-01-14
  Administered 2018-01-14: 1000 mL

## 2018-01-14 MED ORDER — DEXMEDETOMIDINE HCL IN NACL 200 MCG/50ML IV SOLN
INTRAVENOUS | Status: AC
Start: 2018-01-14 — End: ?
  Filled 2018-01-14: qty 50

## 2018-01-14 MED ORDER — HYDROMORPHONE HCL 1 MG/ML IJ SOLN
1.0000 mg | INTRAMUSCULAR | Status: DC | PRN
Start: 2018-01-14 — End: 2018-01-15
  Administered 2018-01-14: 1 mg via INTRAVENOUS
  Filled 2018-01-14 (×2): qty 1

## 2018-01-14 MED ORDER — GLYCOPYRROLATE 1 MG/5ML IJ SOLN
INTRAMUSCULAR | Status: AC
Start: 2018-01-14 — End: ?
  Filled 2018-01-14: qty 5

## 2018-01-14 MED ORDER — ENOXAPARIN SODIUM 40 MG/0.4ML SC SOLN
40.0000 mg | Freq: Every day | SUBCUTANEOUS | Status: DC
Start: 2018-01-15 — End: 2018-01-15

## 2018-01-14 MED ORDER — FAMOTIDINE 10 MG/ML IV SOLN (WRAP)
20.0000 mg | INTRAVENOUS | Status: AC
Start: 2018-01-14 — End: 2018-01-14
  Administered 2018-01-14: 15:00:00 20 mg via INTRAVENOUS

## 2018-01-14 MED ORDER — ROCURONIUM BROMIDE 50 MG/5ML IV SOLN
INTRAVENOUS | Status: AC
Start: 2018-01-14 — End: ?
  Filled 2018-01-14: qty 5

## 2018-01-14 MED ORDER — GLUCOSE 40 % PO GEL
15.00 g | ORAL | Status: DC | PRN
Start: 2018-01-14 — End: 2018-01-15

## 2018-01-14 MED ORDER — CEFAZOLIN SODIUM 1 G IJ SOLR
INTRAMUSCULAR | Status: AC
Start: 2018-01-14 — End: ?
  Filled 2018-01-14: qty 1000

## 2018-01-14 MED ORDER — ONDANSETRON HCL 4 MG/2ML IJ SOLN
4.0000 mg | Freq: Once | INTRAMUSCULAR | Status: DC | PRN
Start: 2018-01-14 — End: 2018-01-14

## 2018-01-14 MED ORDER — MIDAZOLAM HCL 1 MG/ML IJ SOLN (WRAP)
INTRAMUSCULAR | Status: DC | PRN
Start: 2018-01-14 — End: 2018-01-14
  Administered 2018-01-14: 1 mg via INTRAVENOUS

## 2018-01-14 MED ORDER — LACTATED RINGERS IV SOLN
INTRAVENOUS | Status: DC
Start: 2018-01-14 — End: 2018-01-15

## 2018-01-14 MED ORDER — ROCURONIUM BROMIDE 50 MG/5ML IV SOLN
INTRAVENOUS | Status: DC | PRN
Start: 2018-01-14 — End: 2018-01-14
  Administered 2018-01-14: 10 mg via INTRAVENOUS
  Administered 2018-01-14: 40 mg via INTRAVENOUS
  Administered 2018-01-14: 15 mg via INTRAVENOUS
  Administered 2018-01-14 (×2): 10 mg via INTRAVENOUS

## 2018-01-14 MED ORDER — KCL IN DEXTROSE-NACL 20-5-0.45 MEQ/L-%-% IV SOLN
INTRAVENOUS | Status: DC
Start: 2018-01-14 — End: 2018-01-15

## 2018-01-14 MED ORDER — GABAPENTIN 300 MG PO CAPS
300.0000 mg | ORAL_CAPSULE | Freq: Three times a day (TID) | ORAL | Status: DC
Start: 2018-01-15 — End: 2018-01-15
  Administered 2018-01-15 (×2): 300 mg via ORAL
  Filled 2018-01-14 (×2): qty 1

## 2018-01-14 MED ORDER — DIPHENHYDRAMINE HCL 50 MG/ML IJ SOLN
12.5000 mg | Freq: Four times a day (QID) | INTRAMUSCULAR | Status: DC | PRN
Start: 2018-01-14 — End: 2018-01-15

## 2018-01-14 MED ORDER — FENTANYL CITRATE (PF) 50 MCG/ML IJ SOLN (WRAP)
INTRAMUSCULAR | Status: AC
Start: 2018-01-14 — End: ?
  Filled 2018-01-14: qty 5

## 2018-01-14 MED ORDER — GLUCAGON 1 MG IJ SOLR (WRAP)
1.00 mg | INTRAMUSCULAR | Status: DC | PRN
Start: 2018-01-14 — End: 2018-01-15

## 2018-01-14 MED ORDER — CEFAZOLIN SODIUM 1 G IJ SOLR
3.0000 g | INTRAMUSCULAR | Status: AC
Start: 2018-01-14 — End: 2018-01-14
  Administered 2018-01-14: 17:00:00 2 g via INTRAVENOUS
  Administered 2018-01-14: 17:00:00 1 g via INTRAVENOUS
  Filled 2018-01-14: qty 3000

## 2018-01-14 MED ORDER — GABAPENTIN 50 MG/ML UNIT DOSE
600.0000 mg | ORAL | Status: AC
Start: 2018-01-14 — End: 2018-01-14
  Administered 2018-01-14: 15:00:00 600 mg via ORAL

## 2018-01-14 MED ORDER — AMLODIPINE BESYLATE 5 MG PO TABS
5.0000 mg | ORAL_TABLET | Freq: Every day | ORAL | Status: DC
Start: 2018-01-15 — End: 2018-01-15
  Administered 2018-01-15: 10:00:00 5 mg via ORAL
  Filled 2018-01-14: qty 1

## 2018-01-14 MED ORDER — GLYCOPYRROLATE 0.2 MG/ML IJ SOLN
INTRAMUSCULAR | Status: DC | PRN
Start: 2018-01-14 — End: 2018-01-14
  Administered 2018-01-14: .8 mg via INTRAVENOUS
  Administered 2018-01-14: .2 mg via INTRAVENOUS

## 2018-01-14 MED ORDER — DEXTROSE 50 % IV SOLN
12.50 g | INTRAVENOUS | Status: DC | PRN
Start: 2018-01-14 — End: 2018-01-15

## 2018-01-14 MED ORDER — BISACODYL 10 MG RE SUPP
10.0000 mg | Freq: Every day | RECTAL | Status: DC | PRN
Start: 2018-01-15 — End: 2018-01-15

## 2018-01-14 MED ORDER — FENTANYL CITRATE (PF) 50 MCG/ML IJ SOLN (WRAP)
25.0000 ug | INTRAMUSCULAR | Status: AC | PRN
Start: 2018-01-14 — End: 2018-01-14
  Administered 2018-01-14 (×4): 25 ug via INTRAVENOUS
  Filled 2018-01-14: qty 2

## 2018-01-14 MED ORDER — EPINEPHRINE HCL 1 MG/ML IJ SOLN (WRAP)
Status: AC
Start: 2018-01-14 — End: ?
  Filled 2018-01-14: qty 1

## 2018-01-14 MED ORDER — HYOSCYAMINE SULFATE 0.125 MG SL SUBL
0.1250 mg | SUBLINGUAL_TABLET | Freq: Four times a day (QID) | SUBLINGUAL | Status: DC | PRN
Start: 2018-01-14 — End: 2018-01-15
  Administered 2018-01-15 (×2): 0.125 mg via SUBLINGUAL
  Filled 2018-01-14 (×2): qty 1

## 2018-01-14 MED ORDER — ENOXAPARIN SODIUM 40 MG/0.4ML SC SOLN
SUBCUTANEOUS | Status: AC
Start: 2018-01-14 — End: ?
  Filled 2018-01-14: qty 0.4

## 2018-01-14 MED ORDER — FENTANYL CITRATE (PF) 50 MCG/ML IJ SOLN (WRAP)
INTRAMUSCULAR | Status: DC | PRN
Start: 2018-01-14 — End: 2018-01-14
  Administered 2018-01-14 (×2): 25 ug via INTRAVENOUS
  Administered 2018-01-14: 100 ug via INTRAVENOUS

## 2018-01-14 MED ORDER — ON-Q PUMP SINGLE FLOW
Status: DC | PRN
Start: 2018-01-14 — End: 2018-01-14

## 2018-01-14 MED ORDER — PROPOFOL INFUSION 10 MG/ML
INTRAVENOUS | Status: DC | PRN
Start: 2018-01-14 — End: 2018-01-14
  Administered 2018-01-14: 250 mg via INTRAVENOUS

## 2018-01-14 MED ORDER — SUCCINYLCHOLINE CHLORIDE 20 MG/ML IJ SOLN
INTRAMUSCULAR | Status: AC
Start: 2018-01-14 — End: ?
  Filled 2018-01-14: qty 10

## 2018-01-14 MED ORDER — SODIUM CHLORIDE 0.9 % IV SOLN
50.0000 mL/h | INTRAVENOUS | Status: DC
Start: 2018-01-14 — End: 2018-01-15

## 2018-01-14 MED ORDER — NON FORMULARY
1.0000 g | Status: DC
Start: 2018-01-14 — End: 2018-01-14

## 2018-01-14 MED ORDER — ACETAMINOPHEN 10 MG/ML IV SOLN
INTRAVENOUS | Status: AC
Start: 2018-01-14 — End: ?
  Filled 2018-01-14: qty 100

## 2018-01-14 MED ORDER — FAMOTIDINE 20 MG/2ML IV SOLN
INTRAVENOUS | Status: AC
Start: 2018-01-14 — End: ?
  Filled 2018-01-14: qty 2

## 2018-01-14 MED ORDER — HYDRALAZINE HCL 20 MG/ML IJ SOLN
10.0000 mg | Freq: Four times a day (QID) | INTRAMUSCULAR | Status: DC | PRN
Start: 2018-01-14 — End: 2018-01-15

## 2018-01-14 MED ORDER — GABAPENTIN 50 MG/ML UNIT DOSE
ORAL | Status: AC
Start: 2018-01-14 — End: ?
  Filled 2018-01-14: qty 12

## 2018-01-14 MED ORDER — PROPOFOL 10 MG/ML IV EMUL (WRAP)
INTRAVENOUS | Status: AC
Start: 2018-01-14 — End: ?
  Filled 2018-01-14: qty 40

## 2018-01-14 MED ORDER — MIDAZOLAM HCL 1 MG/ML IJ SOLN (WRAP)
INTRAMUSCULAR | Status: AC
Start: 2018-01-14 — End: ?
  Filled 2018-01-14: qty 2

## 2018-01-14 MED ORDER — EPHEDRINE SULFATE 50 MG/ML IJ/IV SOLN (WRAP)
Status: DC | PRN
Start: 2018-01-14 — End: 2018-01-14
  Administered 2018-01-14 (×2): 5 mg via INTRAVENOUS

## 2018-01-14 MED ORDER — LACTATED RINGERS IV SOLN
50.0000 mL/h | INTRAVENOUS | Status: DC
Start: 2018-01-14 — End: 2018-01-15

## 2018-01-14 MED ORDER — ACETAMINOPHEN 10 MG/ML IV SOLN
1000.00 mg | Freq: Once | INTRAVENOUS | Status: AC
Start: 2018-01-14 — End: 2018-01-14
  Administered 2018-01-14: 17:00:00 1000 mg via INTRAVENOUS

## 2018-01-14 MED ORDER — ACETAMINOPHEN 160 MG/5ML PO SOLN
650.0000 mg | ORAL | Status: DC | PRN
Start: 2018-01-15 — End: 2018-01-15

## 2018-01-14 MED ORDER — ALBUTEROL SULFATE 1.25 MG/3ML IN NEBU
1.2500 mg | INHALATION_SOLUTION | RESPIRATORY_TRACT | Status: DC | PRN
Start: 2018-01-14 — End: 2018-01-15

## 2018-01-14 MED ORDER — ENOXAPARIN SODIUM 40 MG/0.4ML SC SOLN
40.0000 mg | Freq: Once | SUBCUTANEOUS | Status: AC
Start: 2018-01-14 — End: 2018-01-14
  Administered 2018-01-14: 16:00:00 40 mg via SUBCUTANEOUS

## 2018-01-14 MED ORDER — ACETAMINOPHEN 10 MG/ML IV SOLN
1000.0000 mg | Freq: Four times a day (QID) | INTRAVENOUS | Status: DC
Start: 2018-01-14 — End: 2018-01-15
  Administered 2018-01-14 – 2018-01-15 (×2): 1000 mg via INTRAVENOUS
  Filled 2018-01-14 (×2): qty 100

## 2018-01-14 MED ORDER — PROMETHAZINE HCL 25 MG/ML IJ SOLN
6.2500 mg | Freq: Once | INTRAMUSCULAR | Status: DC | PRN
Start: 2018-01-14 — End: 2018-01-14

## 2018-01-14 MED ORDER — HYDROMORPHONE HCL 0.5 MG/0.5 ML IJ SOLN
0.2000 mg | INTRAMUSCULAR | Status: DC | PRN
Start: 2018-01-14 — End: 2018-01-14

## 2018-01-14 MED ORDER — OXYCODONE HCL 5 MG/5ML PO SOLN
10.0000 mg | ORAL | Status: DC | PRN
Start: 2018-01-14 — End: 2018-01-15
  Administered 2018-01-15 (×2): 10 mg via ORAL
  Filled 2018-01-14 (×2): qty 10

## 2018-01-14 MED ORDER — VALSARTAN 160 MG PO TABS
160.0000 mg | ORAL_TABLET | Freq: Every day | ORAL | Status: DC
Start: 2018-01-15 — End: 2018-01-15
  Administered 2018-01-15: 10:00:00 160 mg via ORAL
  Filled 2018-01-14: qty 1

## 2018-01-14 SURGICAL SUPPLY — 108 items
ADHESIVE SKIN CLOSURE DERMABOND ADVANCED (Skin Closure) ×2
ADHESIVE SKIN CLOSURE DERMABOND ADVANCED .7 ML LIQUID APPLICATOR (Skin Closure) ×2 IMPLANT
ADHESIVE SKNCLS 2 OCTYL CYNCRLT .7ML (Skin Closure) ×1
BLADE SRGCLPR LF STRL PVT ADJ HD DISP (Blade)
BLADE SURGICAL CLIPPER PIVOT ADJUSTABLE (Blade)
BLADE SURGICAL CLIPPER PIVOT ADJUSTABLE HEAD 9661 PURPLE (Blade) IMPLANT
CLIP SUT PDS LPR TY VCL LF STRL ABS (Suture) ×1
CLIP SUTURE ABSORBABLE LAPRA-TY VICRYL (Suture) ×2
CLIP SUTURE ABSORBABLE LAPRA-TY VICRYL POLYDIOXANONE (Suture) ×2 IMPLANT
COVER EQP SMS PRXM 53X24IN LF STRL (Drape) ×1
COVER EQUIPMENT MEDLINE SMS L53 IN X W24 (Drape) ×2
COVER EQUIPMENT MEDLINE SMS L53 IN X W24 IN MAYOSTAND (Drape) ×2 IMPLANT
DEVICE CLOSURE L6 IN 3-0 SH SPIRAL STRATAFIX SPIRAL PDS PLUS (Suture) ×4 IMPLANT
DEVICE CLSR 3-0 SH SPRL STRATAFIX PDS + (Suture) ×2
DRAPE COLUMN EQUIPMENT DA VINCI XI (Drape) ×2 IMPLANT
DRAPE EQP DVNC XI 21X19X10.5IN ARM 21LB (Drape) ×12
DRAPE EQP DVNC XI CLMN (Drape) ×3
DRAPE EQUIPMENT ARM L21 IN X W19 IN X H10.5 IN DA VINCI XI 21 LB (Drape) ×8 IMPLANT
DRAPE SRG SMS .75 PRXM 77X53IN LF STRL (Drape) ×2
DRAPE SURGICAL SHEET L77 IN X W53 IN (Drape) ×4
DRAPE SURGICAL SHEET L77 IN X W53 IN MEDLINE SMS 3/4 BLUE (Drape) ×4 IMPLANT
DRESSING TRANSPARENT L4 3/4 IN X W4 IN (Dressing)
DRESSING TRANSPARENT L4 3/4 IN X W4 IN POLYURETHANE ADHESIVE (Dressing) IMPLANT
DRESSING TRNS PU STD TGDRM 4.75X4IN LF (Dressing)
GLOVE SRG 6.5 BGL SRG LTX STRL PF BEAD (Glove) ×2
GLOVE SRG NTR RBR 8 INDCTR BGL 299X103MM (Glove) ×1
GLOVE SRG PLISPRN 6.5 BGL PI INDCTR (Glove) ×2
GLOVE SRG PLISPRN 8 BGL PI INDCTR (Glove) ×1
GLOVE SURGICAL 6 1/2 BIOGEL PI INDICATOR (Glove) ×4
GLOVE SURGICAL 6 1/2 BIOGEL PI INDICATOR UNDERGLOVE POWDER FREE SMOOTH (Glove) ×4 IMPLANT
GLOVE SURGICAL 6.5 BIOGEL SURGEONS (Glove) ×4
GLOVE SURGICAL 6.5 BIOGEL SURGEONS POWDER FREE BEAD CUFF MICRO ROUGHEN (Glove) ×4 IMPLANT
GLOVE SURGICAL 8 BIOGEL PI INDICATOR (Glove) ×2
GLOVE SURGICAL 8 BIOGEL PI INDICATOR UNDERGLOVE POWDER FREE SMOOTH (Glove) ×2 IMPLANT
GLOVE SURGICAL 8 INDICATOR BIOGEL POWDER (Glove) ×2
GLOVE SURGICAL 8 INDICATOR BIOGEL POWDER FREE SMOOTH BEAD CUFF (Glove) ×2 IMPLANT
GOWN SRG XL SMARTGOWN LF STRL LVL 4 (Gown) ×2
GOWN SURGICAL XL SMARTGOWN LEVEL 4 (Gown) ×4
GOWN SURGICAL XL SMARTGOWN LEVEL 4 BREATHABLE (Gown) ×4 IMPLANT
HANDLE LGHT LF STRL ADP LGHT CNTRL + TCH (Other) ×1
HANDLE LIGHT ADAPTIVE LIGHT CONTROL PLUS (Other) ×2
HANDLE LIGHT ADAPTIVE LIGHT CONTROL PLUS TECHNOLOGY SNAP ON LENS TOUCH (Other) ×2 IMPLANT
IRRIGATOR SUCTION ERGONOMIC HAND PIECE STRYKEFLOW II (Suction) ×2 IMPLANT
IRRIGATOR SUCTION STRYKEFLOW 2 (Suction) ×2
KIT INFECTION CONTROL CUSTOM (Kits) ×3
KIT INFECTION CONTROL CUSTOM IFOH03 (Kits) ×2 IMPLANT
NEEDLE SPINAL L3 1/2 IN REGULAR WALL QUINCKE TIP OD20 GA BD (Needles) ×2 IMPLANT
NEEDLE SPNL PP RW BD QNCK 20GA 3.5IN LF (Needles) ×3
OBTURATOR BLDLS 8MM LONG (Instrument) ×3
PACK GEN LAPAROSCOPY FFX (Pack) ×3 IMPLANT
PASSER SUT WECK EFX CLS (Procedure Accessories) ×1
REDUCER CANNULA ROBOTIC STANDARD DA VINCI XI ENDOWRIST 12-8MM 470381 (Instrument) ×2 IMPLANT
REDUCER DAVINCI ENDOWRST12-8MM (Instrument) ×3
RELOAD STAPLER D2.5 MM SUREFORM 45 WHITE (Staplers) ×6 IMPLANT
RELOAD SUREFORM 45 BLUE (Staplers) ×12
RELOAD SUREFORM 45 WHITE (Staplers) ×9
SEAL CNN EWRST 12MM STPLR DVNC XI (Procedure Accessories) ×3 IMPLANT
SEAL DAVINCI UNIVERSAL 5-8MM (Procedure Accessories) ×9 IMPLANT
SEALER TISS DVNC SLIM JAW XTD DISP (Instrument) ×3
SEALER TISSUE DA VINCI SLIM JAW EXTEND DISPOSABLE (Instrument) ×2 IMPLANT
SET HEATED TUBE WITH RPT (Ortho Supply) ×3 IMPLANT
SOLUTION IV LACTATED RINGERS 1000 ML (IV Solutions) ×2
SOLUTION IV LACTATED RINGERS 1000 ML PLASTIC CONTAINER (IV Solutions) ×2 IMPLANT
SOLUTION IV LR 1000ML VFLX LF PLS CNTNR (IV Solutions) ×1
SPONGE GAUZE L4 IN X W4 IN 12 PLY (Sponge)
SPONGE GAUZE L4 IN X W4 IN 12 PLY CRINKLE WEAVE FLUFF WOVEN KERLIX (Sponge) IMPLANT
SPONGE GZE KRLX 4X4IN LF NS 12 PLY CRNKL (Sponge)
SPONGE LAP 18X18IN PREWASH WHT (Sponge) ×1
SPONGE LAPAROTOMY L18 IN X W18 IN (Sponge) ×2
SPONGE LAPAROTOMY L18 IN X W18 IN PREWASH WHITE (Sponge) ×2 IMPLANT
STAPLE RELOAD CRTRDG INTERNAL ENDOSCOPIC BLUE SUREFORM 3.5MM TITANIUM (Staplers) ×8 IMPLANT
STAPLER INTERNAL CUTTER RELOADABLE DISPOSABLE SUREFORM 14X45MM 480445 (Staplers) ×2 IMPLANT
STAPLER OBTURATOR ROBOTIC BLADELESS LONG DA VINCI XI ENDOWRIST 8MM (Instrument) ×2 IMPLANT
STAPLER SUREFORM 45 (Staplers) ×3
SUTURE ABS 0 VCL 54IN BRD TIE COAT VIOL (Suture) ×1
SUTURE ABS 2-0 SH VCL 27IN BRD COAT VIOL (Suture)
SUTURE ABS 4-0 PS2 MNCRL MTPS 27IN MFL (Suture) ×2
SUTURE COATED VICRYL 0 L54 IN BRAID TIES (Suture) ×2 IMPLANT
SUTURE COATED VICRYL 2-0 SH L27 IN BRAID (Suture)
SUTURE COATED VICRYL 2-0 SH L27 IN BRAID COATED VIOLET ABSORBABLE (Suture) IMPLANT
SUTURE ETHIBOND EXCEL GREEN 2-0 SH L30 (Suture) ×4
SUTURE ETHIBOND EXCEL GREEN 2-0 SH L30 IN BRAID NONABSORBABLE (Suture) ×4 IMPLANT
SUTURE MONOCRYL 4-0 PS-2 L27 IN (Suture) ×4
SUTURE MONOCRYL 4-0 PS-2 L27 IN MONOFILAMENT UNDYED ABSORBABLE (Suture) ×4 IMPLANT
SUTURE NABSB 2-0 SH EBND EXC 30IN BRD (Suture) ×2
SUTURE NABSB SLK 0 SH PRMHND 30IN BRD (Suture) ×2
SUTURE NABSB SLK 2-0 SH PRMHND 30IN BRD (Suture) ×1
SUTURE PASSER WECK EFX CLASSIC (Procedure Accessories) ×2 IMPLANT
SUTURE SILK PERMA HAND BLACK 0 SH L30 IN (Suture) ×4
SUTURE SILK PERMA HAND BLACK 0 SH L30 IN BRAID NONABSORBABLE (Suture) ×4 IMPLANT
SUTURE SILK PERMA HAND BLACK 2-0 SH L30 (Suture) ×2
SUTURE SILK PERMA HAND BLACK 2-0 SH L30 IN BRAID NONABSORBABLE (Suture) ×2 IMPLANT
SUTURE STRATAFIX SPIRAL PDS PLUS 3-0 SH (Suture) ×4
SYRINGE 30 ML CONCENTRIC TIP GRADUATE (Syringes, Needles) ×6
SYRINGE 30 ML CONCENTRIC TIP GRADUATE NONPYROGENIC DEHP FREE LOK (Syringes, Needles) ×6 IMPLANT
SYRINGE MED 30ML LL LF STRL CONC TIP (Syringes, Needles) ×3
SYSTEM GASTRIC LAVAGE 40FR VISIGI 3D (Procedure Accessories) ×2 IMPLANT
SYSTEM GASTRIC LAVAGE VALVE FENESTRATION 107CM 50FR SLEEVE BULB (Procedure Accessories) ×2 IMPLANT
SYSTEM GSTRC LAV THRMPLST ELSTMR VISIGI (Procedure Accessories) ×3
SYSTEM GSTRC LAV VISIGI 3D 40FR (Procedure Accessories) ×3
SYSTEM IMAGING 8X6IN CLEARIFY MICROFIBER WARM HUB TRCR WIPE DSPSBL (Kits) ×2 IMPLANT
SYSTEM IMG MRFBR CLEARIFY 8X6IN WRM HUB (Kits) ×3
TIP CAUT 8MM STD HOT SHR DVNC EWRST CAUT (Procedure Accessories) ×3
TIP ELECTROCAUTERY HOT SHEARS DA VINCI ENDOWRIST CAUTERY MONOPOLAR 8 (Procedure Accessories) ×2 IMPLANT
TUBING SCT IRR (Suction) ×1
TUNNELER SRG ONQ 17GA 8IN LF STRL SHTH (Procedure Accessories) ×1
TUNNELER SURGICAL L8 IN SHEATH OD17 GA (Procedure Accessories) ×2
TUNNELER SURGICAL L8 IN SHEATH OD17 GA ON-Q* (Procedure Accessories) ×2 IMPLANT

## 2018-01-14 NOTE — Transfer of Care (Signed)
Anesthesia Transfer of Care Note    Patient: Kurt Bates.    Procedures performed: Procedure(s):  ROBOT XI ASSISTED, LAPAROSCOPIC, ROUX-EN-Y GASTRIC BYPASS  ROBOT XI ASSISTED, LAPAROSCOPIC, HERNIA REPAIR, HIATAL  INSERTION, PAIN PUMP (MEDICAL)    Anesthesia type: MAC    Patient location:Phase II PACU    Last vitals:   Vitals:    01/14/18 1930   BP: 149/75   Pulse: 76   Resp: 20   Temp:    SpO2: 100%       Post pain: Patient not complaining of pain, continue current therapy      Mental Status:awake and alert     Respiratory Function: tolerating face mask    Cardiovascular: stable    Nausea/Vomiting: patient not complaining of nausea or vomiting    Hydration Status: adequate    Post assessment: no apparent anesthetic complications and no reportable events    Signed by: Eugene Garnet  01/14/18 7:42 PM

## 2018-01-14 NOTE — Op Note (Signed)
OPERATIVE REPORT      Patient:   Kurt Bates,Kurt KEELS JR.    Date:   01/14/18    Procedure date:   01/14/2018      Pre op diagnosis:   Morbid obesity  BMI 48  Hypertension  Osteoarthritis bilateral knees  Obstructive sleep apnea  Hiatal hernia  GERD    Post op diagnosis:   Morbid obesity  BMI 48  Hypertension  Osteoarthritis bilateral knees  Obstructive sleep apnea  Hiatal hernia  GERD    Procedure:   Procedure(s):  ROBOT XI ASSISTED, LAPAROSCOPIC, ROUX-EN-Y GASTRIC BYPASS  ROBOT XI ASSISTED, LAPAROSCOPIC, HERNIA REPAIR, HIATAL  INSERTION, PAIN PUMP (MEDICAL)                        Surgeon:   Surgeon(s) and Role:     * Demir Titsworth R, DO - Primary   Assistant: Bunnie Pion, CSA, who was essential and present throughout the procedure to help in   positioning, transfer, port placement, skin closure, and   exposure during critical parts of the procedure, also exchanging instruments and sutures.      Estimated blood loss:   minimal    Complications:  none    Anaesthesia:   General Endotracheal Intubation    Findings:   Hiatal hernia was  present.  Roux limb at 150  Biliopancreatic limb at 50  Roux limb is antecolic antegastric    Specimen:  None    Drain:   no    Implant:   * No implants in log *            INDICATIONS:  Kurt Bates. is a 63 y.o. morbidly obese male who has failed multiple previous attempts at conservative weight loss who opted to proceed with robotic laparoscopic Roux-en-Y gastric bypass as a surgical weight loss option understanding all other options including gastric bypass and malabsorptive procedures and gastric banding.  After an extensive preoperative education workup, psychiatric and dietary counseling, meeting all the preoperative clearance criteria and being cleared by medicine was cleared and scheduled for surgery. For a detailed explanation of risks please refer to the hospital and/or office chart.    DESCRIPTION OF PROCEDURE:   The patient was brought to the operating room,  was placed in supine position. General endotracheal anesthesia was instituted. Footboard was placed and pressure points were padded. Surgical pause was made. The abdomen was prepped and draped using normal sterile fashion. A Surgical pause was made followed by an incision was made in the supraumbilical region through which a 8mm da Vinci blunt-tipped trocar was placed under direct vision and laparoscope into the abdominal cavity. The abdomen was insufflated to 15 mmHg with CO2 No intraabdominal injuries from trocar insertion were noted.  An 8 mm da Vinci trocar was then inserted on the left lateral aspect of the 1st trocar approximately 5 cm apart.  Another 5 cm to the left of the 2nd trocar.  The 3rd 8mm da Vinci trocar was inserted.  A 12 mm da Vinci trocar was also inserted in the right subcostal midclavicular line at the mid epigastric area.  The Nathanson's liver retractor was inserted to retract the left lobe of the liver.  This was placed at the epigastric area.  A 40-French VisiGi bougie was placed by the anesthesiologist into the stomach under direct vision.  The robot was then docked and I proceeded to the robotic consult.    Careful dissection on the  angle of Hiss was done to visualize the left crus.  There was evidence of a hiatal hernia.  The anterior fat pad was taken off using da Vinci vessel sealer.  Blunt dissection was carried out until the left crus was exposed.  The hernia sac was then taken down anteriorly using the vessel sealer.  The junction of the right and left crus was exposed.  The gastrohepatic ligament was taken down using the vessel sealer.  This exposed the right crus.  The hernia sac was dissected off the right crus.  Dissection was carried out until the junction of the right and left crus was exposed posterior to the esophagus.  The hernia sac was then circumferentially excised using the vessel sealer.  The esophagus was mobilized 3 cm below the diaphragm by taking down the  mediastinal attachments to the pericardium, pleura, and aorta..  Approximate 4 cm below the GE junction on the right border of the stomach.  A small window was made into the lesser sac.  This was done perigastric to minimize any nerve injury.  A 45 mm da Vinci stapler with a blue load was used to staple horizontally across the stomach, after the bougie was position into the esophagus.  Multiple staples with the 45 mm da Vinci stapler with blue loads were used to make a gastric pouch over a 40-French VisiGi bougie.  There was no fundus included in the gastric pouch.    The hiatal hernia was repaired by approximating the right and left crus using a 0 silk suture in a figure-of-eight fashion posterior to the esophagus over 40 French bougie.  The omentum was then reflected cephalad and the transverse colon elevated to find the ligament of Treitz.  The jejunum was then measured to 50 cm as an omega loop was then brought up towards the gastric pouch, antecolic.  The anti-mesenteric border of the jejunal omega loop was then sutured to the suture line of the gastric pouch using a 30 stratafix PDS suture in a running fashion.  A gastrotomy and enterotomy were made using the Endo Shears with cautery.  A gastrojejunostomy anastomosis was then made in 2 layers using 3-0 PDS stratafix suture on the inner layer and the seromuscular layer was closed using a 30 absorbable V lock suture in a running fashion.  The 40-French bougie was placed through the anastomosis prior to closing the anastomosis to make sure the opening was adequate.  The jejunum was then stapled creating the blind end of the Roux limb using a 45 mm da Vinci stapler with a white load.  This was done by making a small window in the antimesenteric border prior to placing the stapler.  The mesentery was also incised to mobilize the biliopancreatic limb slightly.  The Roux limb was then measured to 150 cm and then I proceeded to make a side-by-side antimesenteric  isoperistaltic anastomosis of the biliopancreatic limb to the Roux limb using a da Vinci 45 mm stapler with a white load.  This was done by 1st making 2 enterotomies on the antimesenteric border of the bowel using the da Vinci shears with cautery.  The common enterotomy was then closed in a running fashion 45 mm da Vinci stapler with a white load.  The jejunojejunostomy mesenteric defect was closed using a running 2-0 Ethibond suture.  The jejunojejunostomy was then checked again.  There were no kinks noted.  The Peterson's defect was then closed using a 2-0 Ethibond suture in a running fashion.  At this point, the patient was then placed back into a supine position from a reverse Trendelenburg position.  A leak test was then performed through the 40-French VisiGi bougie by 1st clamping the Roux limb just distal to the gastrojejunostomy.  The gastrojejunostomy was submerged under water.  The anesthesiologist then proceeded to place air through the VisiGi bougie to distend the gastrojejunostomy and the blind end of the Roux limb.  The pouch was distended to 50 mmHg without any evidence of a leak.  The area was then irrigated.  There was no evidence of any bleeding.  The staple lines were checked.  There was no evidence of any bleeding.  The Roux limb and the jejunojejunostomy were then checked.  There was no obvious injuries noted.  The robot was then undocked and a Nathanson's liver retractor was removed.     A TAP block was done under laparoscopic guidance at bilateral subcostal and flank areas with 0.5 Marcaine with epi.  Under direct vision of the laparoscope, a tunneler and percutaneous sheath were advanced in the preperitoneal space of the left and right upper quadrant subcostal region. The tunneler were then replaced with a SilverSoakers and the SilverSoakers wer bolused with 4 mL of 0.5% Marcaine solution after the percutaneous sheath was removed. The On-Q pump with 540 mL of Marcaine at 4 ml per hour was  then secured onto the SilverSoakers.      Under direct vision of the laparoscope all the trocar sites fascial opening was closed using #0 Vicryl suture and using the Carter-Thomason fascial CloseSure device. The trocars were all removed under direct vision. No bleeding was noted from any of the trocar sites. Skin incisions were approximated using 4-0 Monocryl suture. Sterile dressing was applied. The patient was then extubated and taken to the post anesthesia care unit without any complications, having tolerated the procedure well.  Needle, instrument and sponge count was correct at the end of the case.         Signed by: Nicola Police, DO, FACS, FASMBS, FACOS                                                                              [facility]

## 2018-01-14 NOTE — Anesthesia Preprocedure Evaluation (Signed)
Anesthesia Evaluation    AIRWAY    Mallampati: II    TM distance: >3 FB  Neck ROM: full  Mouth Opening:full  Planned to use difficult airway equipment: No CARDIOVASCULAR    cardiovascular exam normal, regular and normal       DENTAL         PULMONARY    pulmonary exam normal     OTHER FINDINGS                  Relevant Problems   ANESTHESIA   (+) Sleep apnea      PULMONARY   (+) Sleep apnea      NEURO/PSYCH   (+) Hx of colonic polyps      CARDIO   (+) Essential hypertension      GI   (+) Gastroesophageal reflux disease without esophagitis   (+) Hiatal hernia       PSS Anesthesia Comments: Morbid obesity, BMI 48.3, OSA, STOP BANG=7, , Hiatal hernia, Gout, HTN        Anesthesia Plan    ASA 3     general                     intravenous induction   Detailed anesthesia plan: general endotracheal      Post Op: other    Post op pain management: per surgeon    informed consent obtained                   Signed by: Juanell Fairly 01/14/18 3:39 PM

## 2018-01-14 NOTE — Interval H&P Note (Signed)
I have examined the patient and reviewed the H&P. No pertinent change in the patient's condition since the H&P was completed.    Kyvon Hu R. Tanairy Payeur, DO, FACS, FASMBS, FACOS  4:26 PM 01/14/2018

## 2018-01-15 ENCOUNTER — Encounter: Payer: Self-pay | Admitting: Surgery

## 2018-01-15 LAB — BASIC METABOLIC PANEL
Anion Gap: 6 (ref 5.0–15.0)
BUN: 16 mg/dL (ref 9–28)
CO2: 27 mEq/L (ref 22–29)
Calcium: 9.3 mg/dL (ref 8.5–10.5)
Chloride: 104 mEq/L (ref 100–111)
Creatinine: 1.2 mg/dL (ref 0.7–1.3)
Glucose: 101 mg/dL — ABNORMAL HIGH (ref 70–100)
Potassium: 4.7 mEq/L (ref 3.5–5.1)
Sodium: 137 mEq/L (ref 136–145)

## 2018-01-15 LAB — PHOSPHORUS: Phosphorus: 3.1 mg/dL (ref 2.3–4.7)

## 2018-01-15 LAB — CBC
Absolute NRBC: 0 10*3/uL (ref 0.00–0.00)
Hematocrit: 41.2 % (ref 37.6–49.6)
Hgb: 12.9 g/dL (ref 12.5–17.1)
MCH: 28.1 pg (ref 25.1–33.5)
MCHC: 31.3 g/dL — ABNORMAL LOW (ref 31.5–35.8)
MCV: 89.8 fL (ref 78.0–96.0)
MPV: 9.5 fL (ref 8.9–12.5)
Nucleated RBC: 0 /100 WBC (ref 0.0–0.0)
Platelets: 215 10*3/uL (ref 142–346)
RBC: 4.59 10*6/uL (ref 4.20–5.90)
RDW: 13 % (ref 11–15)
WBC: 7.9 10*3/uL (ref 3.10–9.50)

## 2018-01-15 LAB — MAGNESIUM: Magnesium: 1.7 mg/dL (ref 1.6–2.6)

## 2018-01-15 LAB — GFR: EGFR: >60

## 2018-01-15 LAB — GLUCOSE WHOLE BLOOD - POCT
Whole Blood Glucose POCT: 101 mg/dL — ABNORMAL HIGH (ref 70–100)
Whole Blood Glucose POCT: 101 mg/dL — ABNORMAL HIGH (ref 70–100)
Whole Blood Glucose POCT: 106 mg/dL — ABNORMAL HIGH (ref 70–100)

## 2018-01-15 LAB — CK: Creatine Kinase (CK): 422 U/L — ABNORMAL HIGH (ref 47–267)

## 2018-01-15 MED ORDER — ENOXAPARIN SODIUM 40 MG/0.4ML SC SOLN
40.00 mg | Freq: Two times a day (BID) | SUBCUTANEOUS | 0 refills | Status: DC
Start: 2018-01-15 — End: 2018-09-03

## 2018-01-15 MED ORDER — URSODIOL 300 MG PO CAPS
300.00 mg | ORAL_CAPSULE | Freq: Two times a day (BID) | ORAL | 5 refills | Status: DC
Start: 2018-01-29 — End: 2018-04-26

## 2018-01-15 MED ORDER — TRIAMCINOLONE ACETONIDE 40 MG/ML IJ SUSP
INTRAMUSCULAR | Status: AC
Start: 2018-01-15 — End: ?
  Filled 2018-01-15: qty 1

## 2018-01-15 MED ORDER — KETOROLAC TROMETHAMINE 30 MG/ML IJ SOLN
30.00 mg | Freq: Once | INTRAMUSCULAR | Status: AC
Start: 2018-01-15 — End: 2018-01-15
  Administered 2018-01-15: 10:00:00 30 mg via INTRAVENOUS
  Filled 2018-01-15: qty 1

## 2018-01-15 MED ORDER — ONDANSETRON 4 MG PO TBDP
4.0000 mg | ORAL_TABLET | Freq: Three times a day (TID) | ORAL | 0 refills | Status: AC | PRN
Start: 2018-01-15 — End: 2018-02-14

## 2018-01-15 MED ORDER — ACETAMINOPHEN 160 MG/5ML PO SOLN
650.0000 mg | ORAL | Status: DC
Start: 2018-01-15 — End: 2018-01-15
  Administered 2018-01-15 (×2): 650 mg via ORAL
  Filled 2018-01-15 (×3): qty 20.3

## 2018-01-15 MED ORDER — GABAPENTIN 300 MG PO CAPS
300.00 mg | ORAL_CAPSULE | Freq: Three times a day (TID) | ORAL | 0 refills | Status: DC
Start: 2018-01-15 — End: 2018-09-03

## 2018-01-15 MED ORDER — OXYCODONE HCL 5 MG/5ML PO SOLN
5.00 mg | ORAL | 0 refills | Status: AC | PRN
Start: 2018-01-15 — End: 2018-01-22

## 2018-01-15 MED ORDER — LANSOPRAZOLE 30 MG PO CPDR
30.00 mg | DELAYED_RELEASE_CAPSULE | Freq: Every day | ORAL | 1 refills | Status: AC
Start: 2018-01-15 — End: 2018-07-14

## 2018-01-15 MED ORDER — ENOXAPARIN SODIUM 40 MG/0.4ML SC SOLN
40.0000 mg | Freq: Two times a day (BID) | SUBCUTANEOUS | Status: DC
Start: 2018-01-15 — End: 2018-01-15
  Administered 2018-01-15: 10:00:00 40 mg via SUBCUTANEOUS
  Filled 2018-01-15: qty 0.4

## 2018-01-15 MED ORDER — MAGNESIUM SULFATE IN D5W 1-5 GM/100ML-% IV SOLN
1.0000 g | INTRAVENOUS | Status: AC
Start: 2018-01-15 — End: 2018-01-15
  Administered 2018-01-15 (×2): 1 g via INTRAVENOUS
  Filled 2018-01-15 (×2): qty 100

## 2018-01-15 MED ORDER — ACETAMINOPHEN 160 MG/5ML PO SOLN
650.0000 mg | ORAL | 0 refills | Status: DC
Start: 2018-01-15 — End: 2018-09-03

## 2018-01-15 NOTE — Discharge Summary (Signed)
Discharge Summary    Admit Date: 01/14/2018   Discharge Date: 01/15/18    Attending: Josefa Half, DO, FACS  Service: SURGERY    Procedure(s):  ROBOT XI ASSISTED, LAPAROSCOPIC, ROUX-EN-Y GASTRIC BYPASS (N/A)  ROBOT XI ASSISTED, LAPAROSCOPIC, HERNIA REPAIR, HIATAL  INSERTION, PAIN PUMP (MEDICAL)    Consults:  None     Additional Procedures:  None    Admission Diagnoses:   Morbid obesity [E66.01]  Morbid obesity with BMI of 45.0-49.9, adult [E66.01, Z68.42]    Hospital Diagnoses/Problems:   Patient Active Problem List    Diagnosis Date Noted    Bariatric surgery status 01/14/2018    Postoperative pain 01/14/2018    Morbid obesity with BMI of 45.0-49.9, adult 08/28/2017    Sleep apnea 08/28/2017     CPAP      Gastroesophageal reflux disease without esophagitis 08/28/2017    Hiatal hernia 08/28/2017    Hx of colonic polyps 05/15/2017     05/15/2017 Colonoscopy showed 2 polyps and internal hemorrhoids.  GI rec'd repeat coln in 2024.        Keloid 12/26/2013    Chronic gout without tophus, unspecified cause, unspecified site 10/08/2013    Essential hypertension 10/08/2013    H/O knee surgery 10/08/2013    Osteoarthritis of both knees, unspecified osteoarthritis type 10/08/2013         Discharge Diagnoses:   Morbid Obesity s/p Procedure(s):  ROBOT XI ASSISTED, LAPAROSCOPIC, ROUX-EN-Y GASTRIC BYPASS (N/A)  ROBOT XI ASSISTED, LAPAROSCOPIC, HERNIA REPAIR, HIATAL  INSERTION, PAIN PUMP (MEDICAL)    Indication for Admission/HPI:  Kurt Bates. is a 63 y.o. year old male patient with morbid obesity, who presented for elective Procedure(s):  ROBOT XI ASSISTED, LAPAROSCOPIC, ROUX-EN-Y GASTRIC BYPASS (N/A)  ROBOT XI ASSISTED, LAPAROSCOPIC, HERNIA REPAIR, HIATAL  INSERTION, PAIN PUMP (MEDICAL) on 01/14/2018.    Hospital Course:    Postoperatively, he was transferred to the surgical floor in stable condition.  For details of the procedure, please refer to the operative note.  On POD #0, the patient was kept NPO.   Early ambulation and aggressive incentive spirometry was encouraged;  DVT mechanical and chemoprophylaxis was ordered.  On POD #1, the patient was advanced to a bariatric clear liquid diet, which was tolerated well.  The patient was transitioned to PO analgesics, which were adequate for pain relief.  Any nausea that may have been experienced was well controlled with PO antiemetics.  The patient was discharged on POD #1 in stable condition.  At the time of discharge, the patient was hemodynamically stable, tolerating sufficient amounts of liquid, ambulating, voiding, and had adequate pain/nausea control with PO medications.      Significant Diagnostic Studies: none    Treatments: IV hydration, analgesia, anticoagulation: LMW heparin prophylaxis    Discharge Exam:    Today:  BP 128/85    Pulse (!) 59    Temp 99 F (37.2 C) (Oral)    Resp 15    Ht 1.727 m (5\' 8" )    Wt 145.6 kg (321 lb)    SpO2 97%    BMI 48.81 kg/m   Ranges for the last 24 hours:  Temp:  [98.6 F (37 C)-99 F (37.2 C)] 99 F (37.2 C)  Heart Rate:  [59-66] 59  Resp Rate:  [15] 15  BP: (128-142)/(82-92) 128/85  General appearance - alert, well appearing, and in no distress   Eyes - pupils equal and reactive, extraocular eye movements intact   Neck - supple, no  significant adenopathy   Chest - clear, symmetric air entry   Heart - normal rate, regular rhythm   Abdomen - soft, not distended, appropriately TTP. Incisions c/d/i. OnQ pump in place  Extremities - no pedal edema, no clubbing or cyanosis    Disposition: Home or Self Care    Discharge Medications:  Discharge Medication List as of 01/15/2018  7:54 PM      START taking these medications    Details   acetaminophen (TYLENOL) 160 MG/5ML solution Take 20.3 mLs (650 mg total) by mouth every 4 (four) hours, Starting Tue 01/15/2018, Normal      enoxaparin (LOVENOX) 40 MG/0.4ML Solution Inject 0.4 mLs (40 mg total) into the skin every 12 (twelve) hours, Starting Tue 01/15/2018, Normal      gabapentin  (NEURONTIN) 300 MG capsule Take 1 capsule (300 mg total) by mouth every 8 (eight) hours, Starting Tue 01/15/2018, Normal      lansoprazole (PREVACID) 30 MG capsule Take 1 capsule (30 mg total) by mouth daily, Starting Tue 01/15/2018, Until Sun 07/14/2018, Normal      ondansetron (ZOFRAN-ODT) 4 MG disintegrating tablet Take 1 tablet (4 mg total) by mouth every 8 (eight) hours as needed for Nausea, Starting Tue 01/15/2018, Until Thu 02/14/2018, Normal      oxyCODONE (ROXICODONE) 5 MG/5ML solution Take 5 mLs (5 mg total) by mouth every 4 (four) hours as needed (Severe Pain), Starting Tue 01/15/2018, Until Tue 01/22/2018, Normal      ursodiol (ACTIGALL) 300 MG capsule Take 1 capsule (300 mg total) by mouth 2 (two) times daily, Starting Tue 01/29/2018, Until Sun 07/28/2018, Normal         CONTINUE these medications which have NOT CHANGED    Details   amLODIPine-Valsartan-HCTZ 5-160-12.5 MG Tab Take 1 tablet by mouth daily, Starting Thu 08/16/2017, Historical Med             Patient Instructions:   Activity: Ambulate every hour while awake. You may use stairs if you feel you have your balance. No heavy lifting >10lbs  Diet: Bariatric clear liquid diet with clear liquid protein and vitamin supplementation   Wound Care: Keep incisions clean and dry. You may shower - use gentle soap and pat dry     Follow-up with Dr. Jeanell Sparrow in 10-14 days or as previously scheduled

## 2018-01-15 NOTE — Discharge Instr - AVS First Page (Signed)
Drs. Moazzez, Nain, and Pourshojae   [address], [address]   [place], [place] [address]   (O) [phone]   (F) [phone]    [address], [address]   [place], [place] [address]   (O) [phone]   (F) [phone]       PATIENT DISCHARGE INFORMATION: BARIATRIC SURGERY    We are concerned about your health and have developed a few guidelines to help you during your hospitalization and at home. Please call your physician if you have any questions about your care when you get home.    DIET    Only sugar free, caffeine free, non carbonated liquids are allowed. Bariatric clear liquids for 2 weeks plus protein shakes, and then bariatric full liquids for the following week until advanced by your physician or dietitian.  Do not eat solids. Small sips.    Drink the liquids between meals, not with  meals; avoid drinking 30 minutes before, 30 minutes during and 30 minutes after eating. Increase your liquid intake from a minimum of 6 to 8 cups a day as tolerated. Sip on water throughout the day. Ensure to get a minimum of 50 grams (for women) and 63 grams (men) of protein per day from your supplement. It can be mixed with whatever liquid or food you can best tolerate, i.e. applesause, skim milk, etc.    Avoid foods high in sugar or fat. Maintain an adequate protein intake. Avoid nuts, popcorn, some citrus fruits, and soft breads for 2 months. Take chewable multivitamin supplement daily    If vomiting occurs, and persists more than 24 hours, please notify your doctor.    ACTIVITY    Avoid heavy lifting (more than 10 pounds) or strenuous exercise for at least 4 weeks.    Walk daily, gradually increasing speed with a goal of 2 miles.  Ok to go up and down stairs.    MEDICATION    Follow the medication instructions as written by your doctor. Break all medications in half or crush unless advised differently from your doctor.    You should refrain from drinking alcohol, you should refrain from driving if you are  taking a narcotic medication for pain.    If the pain is unrelieved by the prescribed medication, please call you doctor.    Take all medications as prescribed.  - Take the Tylenol (650 mg or 20 mL) every four hours for pain control for the next 3-5 days and then as needed.  Suggested times are 7:00am, 11:00am, 3:00pm, and 7:00pm.  You can add another dose at 11:00pm if needed before bedtime . Do not exceed 4 grams (4000 mg) of tylenol total in 24 hours. The current dosing will give you 2600 mg- 3250 mg which is below the daily maximum.     - Take the gabapentin three times a day for the next 3-5 days and then as needed for pain.  This is for sharper pain.    - Take the oxycodone (or Dilaudid, whichever you were prescribed) as needed for severe pain.  This is a narcotic and has additive properties if used and used for a prolonged period of time.  You cannot operate heavy machinery (a vehicle) while on narcotics.  Contact your insurance company for official recommendations for when you can drive after discontinuing narcotic medications.     - Take Prevacid (lansoprazole) every day.  This is for acid and prevention of ulcers.    - Use Zofran (ondansetron) as   needed for nausea.    - If you have received Lovenox (blood thinner), please use as instructed (2 -4 weeks)    - If you are prescribed URSODIOL (Actigall) start that medication 2 weeks after surgery.  This is for prevention of gallstones.  You can still develop gallstones but this therapy helps to limit this process with rapid weight loss.     ON Q PAIN BALL:  - Your OnQ pain ball can leak and soil the dressing.  You can apply another bandage at the insertion site.  If the leaking continues and becomes a hassle, you may remove the On Q pump earlier than 5 days.    - Remove OnQ pump (if attached) once empty by removing the tape and catheters completely.    - Remove OnQ pump when empty. See instruction booklet provided. The OnQ empties over 5 days.      - When  removing the O Q there is glue at the insertion site.  This CAN and WILL COME OFF when removing the catheters.  THAT IS OK.  Remove the catheters and place a dressing over top.  The insertion site will heal over the next 48-72 hours.      - There is approximately 12 inches of catheter in your soft tissue remove as instructed in your discharge video.      INCISION    Keep your incision clean and dry. You may shower daily and pat your incision dry. No bathing or swimming for 4 weeks    Check you incision daily and notify your doctor if you notice any redness or drainage.    It is not necessary to take your temperature routinely, but if you feel like you have a fever and it is 101 degrees Farenheit or more, please call your doctor.    OTHER CARE MEASURES    Continue to use incentive spirometer 10 times per hour while awake until your next doctor visit.  Make sure you are drinking enough and urinating at least 4-6 times per day.    Notify physician with:  -fever >101  -one sided calf pain  -vomiting  -increasing abdominal pain  -sudden onset shoulder pain    MAKE SURE YOU REMAIN HYDRATED.  You can keep water with you at all times and should sip to maintain hydration.     TENTATIVE SCHEDULE:    7:00am:  Morning water 6-8oz, Breakfast, Tylenol/Gabapentin  8:00am:  Daily Medications  9:00am:  Protein Shake/Lovenox  10:00am:  Vitamins  11:00am:  Water 6-8 oz, Tylenol  12:00pm:  Lunch  1:00pm:  Water 6-8 oz  2:00pm:  Water 6-8 oz  3:00pm:  Medications (Gabapentin/Tylenol)  4:00pm:  Water 6-8 oz  5:00pm:  Dinner  6:00pm:  Water 6-8 oz  7:00pm:  Multivitamin, Tylenol  9:00pm:  Water, Gabapentin, second Lovenox (if prescribed twice daily)  11:00pm: Tylenol if needed      CONGRATULATIONS on your commitment to your success.  Fall in love with your success and JOIN US ON FACEBOOK!    This online community is designed to be a site for support to all of our POST OPERATIVE bariatric patients.  This will connect you with our staff and  our patients.  Follow the process and share your story.  YOU ARE NOT ALONE and we all need to be engaged to support and educate each other.  Welcome to the [facility].    [email or web address]

## 2018-01-15 NOTE — Progress Notes (Signed)
The patient was discharged to home with On Q pump. All discharge instructions given to patient as well as prescriptions and on Q instructions. The patient verbalized understanding of all discharge instructions. Pt medicated with Oxycodone and Tylenol prior to discharge. The patient's vital signs are stable. The patient is voiding adequately. The patient is to follow up with the MD. Iv removed

## 2018-01-15 NOTE — Consults (Signed)
**Note De-identified  Obfuscation** Inpatient Bariatric Nutrition Education       Surgery Type: RNY  Surgery Date: 01/14/18     Nutrition education done by surgeon's RD pre-operatively: Yes    Reinforced nutritional guidelines: Yes    Instructed patient on adequate fluid intake with goal of drinking 64 ounces/day: Yes    Patient verbalized understanding of diet progression: Yes    Patient has home supply of protein supplements: Yes    Patient has home supply of vitamins and minerals: Yes    Going Home Nutrition information reviewed with patient: Yes    Educational materials/handouts given to patient: Yes    Patient verbalized comprehension of bariatric nutrition guidelines to follow upon discharge: Yes    Notes: Questions answered.

## 2018-01-15 NOTE — UM Notes (Signed)
**   This review is compiled from documentation provided by the treatment team within the patient's medical record. Krystal Eaton, RN, BSN  Clinical Case Manager - Utilization Review  [facility]   [address]   [place], [place] [address]  NPI: [id number]  Tax ID: [id number]  Phone: [phone]  Fax: [phone]     Please use fax number or insurance line to provide authorization for hospital services or to request additional information.     DATE OF REVIEW: 1/6-1/7    1/6:  63 y.o. male presents for elective procedure:    ROBOT XI ASSISTED, LAPAROSCOPIC, GASTRIC BYPASS    01/14/18 1432  Admit to Inpatient Once    Status:    Question Answer Comment   Admitting Physician POURSHOJAE, HAMID R    Diagnosis Morbid obesity with BMI of 45.0-49.9, adult    Estimated Length of Stay > or = to 2 midnights    Tentative Discharge Plan? Home or Self Care    Patient Class Inpatient    Update Service Bariatrics        01/14/18 1432            1/7:  POD #1    ASSESSMENT:  Karlyn Agee Fredrik Mogel. is a 63 y.o. 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  Clear liquids  DVT prophylaxis  Incentive spirometry  Ambulate  [place] home today if parameters met    Labs: CK 422 (H)    Vitals:    01/15/18 0735 01/15/18 0936 01/15/18 1127 01/15/18 1605   BP: (!) 158/92 128/81 134/82 (!) 142/92   Pulse: 61  64 66   Resp: 16  15 15    Temp: 99.3 F (37.4 C)  98.8 F (37.1 C) 98.6 F (37 C)   TempSrc: Oral  Oral Oral   SpO2: 96%  97% 98%   Weight:       Height:         Scheduled Meds:  Current Facility-Administered Medications   Medication Dose Route Frequency    acetaminophen  650 mg Oral Q4H    amLODIPine  5 mg Oral Daily    enoxaparin  40 mg Subcutaneous Q12H    famotidine  20 mg Intravenous Q12H SCH    gabapentin  300 mg Oral Q8H    valsartan  160 mg Oral Daily     Continuous Infusions:   dextrose 5 % and 0.45 % NaCl with KCl 20 mEq 150  mL/hr at 01/15/18 1229     PRN Meds:.albuterol, bisacodyl, Nursing communication: Adult Hypoglycemia Treatment Algorithm **AND** dextrose **AND** dextrose **AND** glucagon (rDNA), diphenhydrAMINE, hydrALAZINE, HYDROmorphone, hyoscyamine SL, ondansetron, oxyCODONE, scopolamine, simethicone

## 2018-01-15 NOTE — Progress Notes (Signed)
[facility]    Progress Note          POD# 1 ABX: none         ASSESSMENT:  Kurt Bates. is a 63 y.o. 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  Clear liquids  DVT prophylaxis  Incentive spirometry  Ambulate  Mobile home today if parameters met      SUBJECTIVE:    Overnight events: Denies any nausea vomiting.  Voiding.      OBJECTIVE:    Physical Exam:  Temp (24hrs), Avg:98.3 F (36.8 C), Min:97 F (36.1 C), Max:99.5 F (37.5 C)   BP (!) 158/92    Pulse 61    Temp 99.3 F (37.4 C) (Oral)    Resp 16    Ht 5\' 8"     Wt 321 lb    SpO2 96%    BMI 48.81 kg/m     Intake/Output Summary (Last 24 hours) at 01/15/2018 0757  Last data filed at 01/15/2018 0700  Gross per 24 hour   Intake 3855 ml   Output 420 ml   Net 3435 ml         PHYSICAL EXAM  General:  Patient appeared in no distress  Vitals:  Vital signs reviewed, see nurses notes  Neck:  No JVD, or lymphadenopathy.  Cardiovascular:  Regular rhythm, Normal sounds and absence of murmurs, rubs or gallops.  Lungs:  Clear to auscultation without rales, ronchi, or wheezing.  Abdomen:  Soft, non distended, bowel sounds active, incision(s) are clean, dry, and intact.  Extremities:  Non-edematous and Normal distal pulses.  Neuro:  Nonfocal and Normal sensation.        Data Review    Results     Procedure Component Value Units Date/Time    Creatine Kinase (CK) [[id number]]  (Abnormal) Collected:  01/15/18 0633    Specimen:  Blood Updated:  01/15/18 0738     Creatine Kinase (CK) 422 U/L     Narrative:       If indicated    GFR [[id number]] Collected:  01/15/18 7829     Updated:  01/15/18 0738     EGFR >60.0    Narrative:       If indicated    Basic Metabolic Panel [[id number]]  (Abnormal) Collected:  01/15/18 0633    Specimen:  Blood Updated:  01/15/18 0738     Glucose 101 mg/dL      BUN 16 mg/dL      Creatinine 1.2 mg/dL      Calcium 9.3 mg/dL      Sodium 784 mEq/L      Potassium 4.7 mEq/L       Chloride 104 mEq/L      CO2 27 mEq/L      Anion Gap 6.0    Narrative:       If indicated    Magnesium [[id number]] Collected:  01/15/18 1324    Specimen:  Blood Updated:  01/15/18 0738     Magnesium 1.7 mg/dL     Narrative:       If indicated    Phosphorus [[id number]] Collected:  01/15/18 6644    Specimen:  Blood Updated:  01/15/18 0738     Phosphorus 3.1 mg/dL     Narrative:       If indicated    CBC without differential [[id number]]  (Abnormal) Collected:  01/15/18 0633    Specimen:  Blood Updated:  01/15/18 0712     WBC 7.90 x10 3/uL      Hgb 12.9 g/dL      Hematocrit 16.1 %      Platelets 215 x10 3/uL      RBC 4.59 x10 6/uL      MCV 89.8 fL      MCH 28.1 pg      MCHC 31.3 g/dL      RDW 13 %      MPV 9.5 fL      Nucleated RBC 0.0 /100 WBC      Absolute NRBC 0.00 x10 3/uL     Narrative:       If indicated    Glucose Whole Blood - POCT [[id number]]  (Abnormal) Collected:  01/15/18 0605     Updated:  01/15/18 0615     POCT - Glucose Whole blood 101 mg/dL     Glucose Whole Blood - POCT [[id number]]  (Abnormal) Collected:  01/15/18 0025     Updated:  01/15/18 0054     POCT - Glucose Whole blood 106 mg/dL     Type and Screen [[id number]] Collected:  01/14/18 1503    Specimen:  Blood Updated:  01/14/18 1611     ABO Rh A POS     AB Screen Gel NEG    Glucose Whole Blood - POCT [[id number]] Collected:  01/14/18 1511     Updated:  01/14/18 1523     POCT - Glucose Whole blood 84 mg/dL           Xr Chest 2 Views    Result Date: 12/21/2017  Reason for exam: 63 year old male undergoing preoperative physical examination prior to bariatric surgery. FINDINGS: PA and lateral views. The heart size and contour are normal.  The thoracic aorta is moderately tortuous. Lungs are clear with normal pulmonary vascularity.  No pleural effusion, hilar or mediastinal prominence is evident. There is no pneumothorax.     No active cardiopulmonary disease. Wilmon Pali, MD 12/21/2017 10:46 AM    US Abdomen Complete    Result Date: 12/27/2017   HISTORY: 63 year old male with morbid obesity, preoperative exam prior to bariatric surgery. FINDINGS: Exam slightly limited due to patient body habitus. Liver: Top normal size measuring up to 16.5 cm, normal contour and echotexture with no detectable lesion. Pancreas: Normal. Gallbladder: Normal. No gallstone or sonographic Murphy's sign. Bile ducts: Normal. Common duct measures 2 mm. Spleen: Normal measuring up to 8.1 cm. Aorta: Normal caliber. IVC: Visualized IVC is unremarkable. Free fluid: None. Kidneys: Normal size and echotexture with no obstruction. Right kidney measures 10.9 cm in length. Left kidney measures 11.8 cm in length. There is a 5.4 x 4.7 x 5.0 cm left renal cortical simple cyst.     Left renal cortical cyst measuring up to 5.4 cm. Otherwise unremarkable exam. Adela Glimpse, MD 12/27/2017 2:10 PM      Collyn Ribas R. Meta Hatchet, [facility]  [address], [address]  [place], [place]  [address]    [address], [address]  [place], [place]  [address]    [phone]  [email or web address]  Electronically Signed on 01/15/2018, 7:57 AM

## 2018-01-15 NOTE — Progress Notes (Signed)
Bariatric Nurse Program Coordinator Inpatient Note    Surgery Type: ROBOT XI ASSISTED, LAPAROSCOPIC, ROUX-EN-Y GASTRIC BYPASS   Post Operative Day #:1     Patient is experiencing adequate pain control and understands available pain control modalities : Yes    Patient encouraged to use incentive spirometer ten times an hour while awake: Yes     Patient to pump feet while in bed to minimize the risk of VTE, wearing compression wraps as ordered: Yes    Patient instructed to ambulate at least 4 times per day on unit: Yes    Patient aware to gradually increase activity upon discharge: Yes    Patient has completed pre-operative education bundle, including watching videos and completing quiz. Patient verbalizes understanding of the life style changes after surgery: Yes    Encouraged the patient to attend bariatric support group meetings regularly upon discharge: Yes    Advised to follow up with the surgeon's office as directed: Yes    Notes: Patient alert, questions answered. Provided patient information on On Qpump, including removal instructions. Patient verbalized understanding.

## 2018-01-15 NOTE — Plan of Care (Signed)
Problem: Day of Surgery-Gastric Bypass  Goal: Patient has stable vital signs and fluid balance  Outcome: Not Progressing  Flowsheets (Taken 01/15/2018 0112)  Patient has stable vital signs and fluid balance: Monitor vital signs; Monitor/assess O2 saturation; Monitor intake and output. Notify LIP if urine output is less than 240 mL in 8 hours; Monitor lab values. Notify LIP of abnormal results.  Note:   Urinary retention. Frequent encouragement provided to ambulate and attempt to void.      Problem: Day of Surgery-Gastric Bypass  Goal: Pain at adequate level as identified by patient  Outcome: Progressing  Flowsheets (Taken 01/15/2018 0112)  Pain at adequate level as identified by patient: Identify patient comfort function goal; Evaluate if patient comfort function goal is met; Assess and monitor incisional On-Q pump; Administer analgesics as prescribed to achieve pain goal  Goal: Adequate oxygenation and ventilation is maintained  Outcome: Progressing  Flowsheets (Taken 01/15/2018 0112)  Adequate oxygenation and ventilation is maintained: Monitor/assess patient's mental status and LOC; Monitor/assess patient's respiratory status (RR, depth, effort, breath sounds); Monitor/assess O2 saturation; Position patient for maximum ventilatory efficiency, head of bed at 30 degrees; Teach/reinforce use of incentive spirometer 10 times per hour while awake, cough and deep breath as needed; Apply CPAP as ordered during any periods of rest  Goal: Mobility/activity is maintained at optimum level for patient  Outcome: Progressing  Flowsheets (Taken 01/15/2018 0112)  Mobility/activity is maintained at optimum level for patient: VTE Prevention: administer anticoagulant(s) and/or apply anti-embolism stockings/devices as ordered

## 2018-01-15 NOTE — Plan of Care (Signed)
Problem: Safety  Goal: Patient will be free from injury during hospitalization  Outcome: Progressing  Flowsheets (Taken 01/15/2018 1433)  Patient will be free from injury during hospitalization : Assess patient's risk for falls and implement fall prevention plan of care per policy; Ensure appropriate safety devices are available at the bedside; Include patient/ family/ care giver in decisions related to safety; Provide and maintain safe environment; Use appropriate transfer methods; Hourly rounding; Assess for patients risk for elopement and implement Elopement Risk Plan per policy  Note:   Patient will be free from injury/falls during this hospital stay. Call light within reach, bed low, bed alarm on. Purposeful rounding. Continue to monitor  Goal: Patient will be free from infection during hospitalization  Outcome: Progressing  Flowsheets (Taken 01/15/2018 1433)  Free from Infection during hospitalization: Assess and monitor for signs and symptoms of infection; Monitor lab/diagnostic results; Monitor all insertion sites (i.e. indwelling lines, tubes, urinary catheters, and drains); Encourage patient and family to use good hand hygiene technique     Problem: Pain  Goal: Pain at adequate level as identified by patient  Outcome: Progressing  Flowsheets (Taken 01/15/2018 1433)  Pain at adequate level as identified by patient: Identify patient comfort function goal; Assess for risk of opioid induced respiratory depression, including snoring/sleep apnea. Alert healthcare team of risk factors identified.; Assess pain on admission, during daily assessment and/or before any "as needed" intervention(s); Reassess pain within 30-60 minutes of any procedure/intervention, per Pain Assessment, Intervention, Reassessment (AIR) Cycle; Evaluate if patient comfort function goal is met; Evaluate patient's satisfaction with pain management progress; Offer non-pharmacological pain management interventions  Note:   Patient pain managed with  scheduled Tylenol, Gabapentin. Toradol IV given x 1. Patient to try Roxicodone prior to discharge. Continue to assess pain level and administer pain medication as needed     Problem: Day 1 Post-Op-Gastric Bypass  Goal: Patient has stable vital signs and fluid balance  Outcome: Progressing  Flowsheets (Taken 01/15/2018 1433)  Patient has stable vital signs and fluid balance: Monitor vital signs; Monitor intake and output. Notify LIP if urine output is less than 240 mL in 8 hours; Remove and document removal of Foley Catheter per order, if not previously done; Monitor/assess O2 saturation; Monitor lab values. Notify LIP of abnormal results.; Monitor/assess output from surgical drain if present  Note:   Patient vital signs stable, pt voiding. Continue to monitor  Goal: Adequate oxygenation and ventilation is maintained  Outcome: Progressing  Flowsheets (Taken 01/15/2018 1433)  Adequate oxygenation and ventilation is maintained: Monitor/assess patient's mental status and LOC; Position patient for maximum ventilatory efficiency, head of bed at 30 degrees; Monitor/assess patient's respiratory status (RR, depth, effort, breath sounds); Teach/reinforce use of incentive spirometer 10 times per hour while awake, cough and deep breath as needed; Apply CPAP as ordered during any periods of rest  Note:   Patient oxygenation level stable above 95 % on RA. CPAP at bedside. Continue to monitor  Goal: Mobility/activity is maintained at optimum level for patient  Outcome: Progressing  Flowsheets (Taken 01/15/2018 1433)  Mobility/activity is maintained at optimum level for patient: Out of bed to chair with assistance; Special equipment as needed (trapeze); Supervised progressive ambulation (bed/chair exercises, ambulate in hall 3-4 x a day, BSC, or BRP); VTE Prevention: administer anticoagulant(s) and/or apply anti-embolism stockings/devices as ordered; Teach/review/reinforce ankle pump exercises  Note:   Patient up ambulation in halls.  Continue to encourage ambulation  Goal: Patient's nutritional intake is adequate  Outcome: Progressing  Flowsheets (Taken 01/15/2018 1433)  Nutritional intake is adequate: Monitor intake and output; Encourage frequent sips (15mL) of water every 10 minutes if ordered; Assess GI status (bowel sounds, nausea/vomiting, distention, flatus); Administer acid-reducer meds as prescribed; Advance diet per MD order; Bariatric dietician consult  Note:   Patient tolerating bariatric clear liquid diet, no nausea, no vomiting. Continue to monitor

## 2018-01-18 ENCOUNTER — Telehealth: Payer: Self-pay

## 2018-01-18 NOTE — Telephone Encounter (Signed)
Discharge Phone Call for Bariatric Surgery Patients     The  questions I'm going to ask you are about your progress since you had your surgery:    1. Are you able to drink the recommended daily amount of fluids (48 ounces minimum/day) and protein (60-80 grams/day) as prescribed by your surgeon and dietitian?  Yes    2. Are you experiencing nausea, vomiting, or diarrhea?  No    3. Do you have the number to contact your surgeon if you have problems or questions?  Yes        Notes:  Questions answered.

## 2018-01-21 NOTE — Anesthesia Postprocedure Evaluation (Signed)
Anesthesia Post Evaluation    Patient: Kurt Bates.    Procedure(s):  ROBOT XI ASSISTED, LAPAROSCOPIC, ROUX-EN-Y GASTRIC BYPASS  ROBOT XI ASSISTED, LAPAROSCOPIC, HERNIA REPAIR, HIATAL  INSERTION, PAIN PUMP (MEDICAL)    Anesthesia type: general    Last Vitals:   Vitals Value Taken Time   BP 152/68 01/14/2018  8:10 PM   Temp 36.1 C (97 F) 01/14/2018  7:20 PM   Pulse 66 01/14/2018  8:10 PM   Resp 16 01/14/2018  8:10 PM   SpO2 98 % 01/14/2018  8:10 PM                 Anesthesia Post Evaluation:     Patient Evaluated: PACU    Level of Consciousness: awake  Pain Score: 0  Pain Management: adequate    Airway Patency: patent    Anesthetic complications: No      PONV Status: none    Cardiovascular status: stable  Respiratory status: room air  Hydration status: stable        Signed by: Juanell Fairly, 01/21/2018 7:01 AM

## 2018-01-25 ENCOUNTER — Ambulatory Visit (INDEPENDENT_AMBULATORY_CARE_PROVIDER_SITE_OTHER): Payer: BLUE CROSS/BLUE SHIELD | Admitting: Surgery

## 2018-01-25 ENCOUNTER — Encounter (HOSPITAL_BASED_OUTPATIENT_CLINIC_OR_DEPARTMENT_OTHER): Payer: Self-pay | Admitting: Surgery

## 2018-01-25 ENCOUNTER — Ambulatory Visit (INDEPENDENT_AMBULATORY_CARE_PROVIDER_SITE_OTHER): Payer: BLUE CROSS/BLUE SHIELD

## 2018-01-25 VITALS — BP 123/84 | HR 87 | Temp 98.4°F | Ht 68.0 in | Wt 311.0 lb

## 2018-01-25 DIAGNOSIS — Z6841 Body Mass Index (BMI) 40.0 and over, adult: Secondary | ICD-10-CM

## 2018-01-25 DIAGNOSIS — Z719 Counseling, unspecified: Secondary | ICD-10-CM

## 2018-01-25 DIAGNOSIS — I10 Essential (primary) hypertension: Secondary | ICD-10-CM

## 2018-01-25 DIAGNOSIS — Z9884 Bariatric surgery status: Secondary | ICD-10-CM

## 2018-01-25 DIAGNOSIS — Z713 Dietary counseling and surveillance: Secondary | ICD-10-CM

## 2018-01-25 MED ORDER — AMLODIPINE BESYLATE 5 MG PO TABS
5.0000 mg | ORAL_TABLET | Freq: Every day | ORAL | 1 refills | Status: DC
Start: 2018-01-25 — End: 2018-04-09

## 2018-01-25 MED ORDER — VALSARTAN 160 MG PO TABS
160.00 mg | ORAL_TABLET | Freq: Every day | ORAL | 1 refills | Status: AC
Start: 2018-01-25 — End: 2018-07-24

## 2018-01-25 NOTE — Progress Notes (Signed)
S:  Pt presents for f/u after bypass procedure.  Pt states tolerating clear liquid diet well.  Pt reports drinking 64 oz clear fluids daily and consuming about 60 grams protein from supplemental sources daily.  Pt reports taking all the recommended vitamin/mineral supplements at this time.  Pt is 11 days out from surgery and has lost 10 pounds so far.  Pt reports no issues w N/V/C/D at this time.    O:  Today's Wt: 311 lb   Previous Wts:    Wt Readings from Last 10 Encounters:   01/25/18 311 lb   01/14/18 321 lb   01/11/18 321 lb   01/11/18 321 lb   12/21/17 329 lb   11/30/17 326 lb   10/19/17 331 lb 3.2 oz   09/11/17 322 lb   08/28/17 321 lb   05/07/17 303 lb    BMI:  Body mass index is 47.29 kg/m.    A:  Pt appears to be getting adequate hydration and protein as evidenced by diet recall.  Pt verbalizes comprehension and desired compliance with next diet stage.    P:  1.  Advance diet to full liquids on 01/19 and then to soft diet on 01/26.  Diet guidelines reviewed and pt verbalized understanding and desired compliance.  2.  F/u in 6 weeks for diet advance to solids.      Spent a total of 15 minutes educating pt in a individual one-on-one setting.

## 2018-01-25 NOTE — Progress Notes (Signed)
Assessment:  Status Post robotic laparoscopic RNY-Gastric Bypass      Plan:  1.  Diet advance as per dietitian  2.  Patient is to increase exercise.  3.  Patient is to continue with supplements.  4.  Patient is to follow up with PCP as needed.  5.  Patient is to follow up in 6 week(s)          Subjective:  Kurt Bates. Kurt Bates returns for follow up 11 day(s) post robotic laparoscopic RNY-Gastric Bypass. He is doing well with 10 Lbs. total weight loss. He is tolerating a liquid diet and denies any nausea, vomiting, reflux or abdominal pain.  He is compliant with portion control and vitamin supplementation. His energy level is good. He is walking regularly  Objective:  The following portions of the patient's history were reviewed and updated as appropriate: allergies, current medications, past family history, past medical history, past social history, past surgical history and problem list.    General appearance: alert, appears stated age and cooperative  Head: Normocephalic, without obvious abnormality, atraumatic  Eyes: negative findings: conjunctivae and sclerae normal  Neck: no adenopathy, no carotid bruit, no JVD, supple, symmetrical, trachea midline and thyroid not enlarged, symmetric, no tenderness/mass/nodules  Lungs: clear to auscultation bilaterally  Heart: regular rate and rhythm, S1, S2 normal, no murmur, click, rub or gallop  Abdomen: soft, non-tender; bowel sounds normal; no masses,  no organomegaly and Incisions are clean, dry, and intact  Extremities: extremities normal, atraumatic, no cyanosis or edema, Homans sign is negative, no sign of DVT and no edema, redness or tenderness in the calves or thighs      Admission on 01/14/2018, Discharged on 01/15/2018   Component Date Value Ref Range Status    ABO Rh 01/14/2018 A POS   Final    AB Screen Gel 01/14/2018 NEG   Final    POCT - Glucose Whole blood 01/14/2018 84  70 - 100 mg/dL Final    Glucose 16/10/9602 101* 70 - 100 mg/dL Final     Comment: ADA guidelines for diabetes mellitus:  Fasting:  Equal to or greater than 126 mg/dL  Random:   Equal to or greater than 200 mg/dL      BUN 54/09/8117 16  9 - 28 mg/dL Final    Creatinine 14/78/2956 1.2  0.7 - 1.3 mg/dL Final    Calcium 21/30/8657 9.3  8.5 - 10.5 mg/dL Final    Sodium 84/69/6295 137  136 - 145 mEq/L Final    Potassium 01/15/2018 4.7  3.5 - 5.1 mEq/L Final    Chloride 01/15/2018 104  100 - 111 mEq/L Final    CO2 01/15/2018 27  22 - 29 mEq/L Final    Anion Gap 01/15/2018 6.0  5.0 - 15.0 Final    WBC 01/15/2018 7.90  3.10 - 9.50 x10 3/uL Final    Hgb 01/15/2018 12.9  12.5 - 17.1 g/dL Final    Hematocrit 28/41/3244 41.2  37.6 - 49.6 % Final    Platelets 01/15/2018 215  142 - 346 x10 3/uL Final    RBC 01/15/2018 4.59  4.20 - 5.90 x10 6/uL Final    MCV 01/15/2018 89.8  78.0 - 96.0 fL Final    MCH 01/15/2018 28.1  25.1 - 33.5 pg Final    MCHC 01/15/2018 31.3* 31.5 - 35.8 g/dL Final    RDW 01/11/7251 13  11 - 15 % Final    MPV 01/15/2018 9.5  8.9 - 12.5 fL  Final    Nucleated RBC 01/15/2018 0.0  0.0 - 0.0 /100 WBC Final    Absolute NRBC 01/15/2018 0.00  0.00 - 0.00 x10 3/uL Final    Magnesium 01/15/2018 1.7  1.6 - 2.6 mg/dL Final    Phosphorus 08/65/7846 3.1  2.3 - 4.7 mg/dL Final    Creatine Kinase (CK) 01/15/2018 422* 47 - 267 U/L Final    POCT - Glucose Whole blood 01/15/2018 106* 70 - 100 mg/dL Final    EGFR 96/29/5284 >60.0   Final    Comment: Disease State Reference Ranges:    Chronic Kidney Disease; < 60 ml/min/1.73 sq.m    Kidney Failure; < 15 ml/min/1.73 sq.m    [Calculated using IDMS-Traceable MDRD equation (based on    gender, age and black vs. non-black race) recommended by    [employer] Kidney Disease Education Program. No data    available for non-white, non-black race.]  GFR estimates are unreliable in patients with:    Rapidly changing kidney function or recent dialysis,    extreme age, body size or body composition(obesity,    severe malnutrition). Abnormal  muscle mass (limb    amputation, muscle wasting). In these patients,    alternative determinations of GFR should be obtained.      POCT - Glucose Whole blood 01/15/2018 101* 70 - 100 mg/dL Final    POCT - Glucose Whole blood 01/15/2018 101* 70 - 100 mg/dL Final   Hospital Outpatient Visit on 12/21/2017   Component Date Value Ref Range Status    Ventricular Rate 12/21/2017 63  BPM Final    Atrial Rate 12/21/2017 63  BPM Final    P-R Interval 12/21/2017 156  ms Final    QRS Duration 12/21/2017 116  ms Final    Q-T Interval 12/21/2017 428  ms Final    QTC Calculation (Bezet) 12/21/2017 437  ms Final    P Axis 12/21/2017 78  degrees Final    R Axis 12/21/2017 -49  degrees Final    T Axis 12/21/2017 41  degrees Final    Ventricular Rate 12/21/2017 56  BPM Preliminary    Atrial Rate 12/21/2017 56  BPM Preliminary    P-R Interval 12/21/2017 154  ms Preliminary    QRS Duration 12/21/2017 120  ms Preliminary    Q-T Interval 12/21/2017 434  ms Preliminary    QTC Calculation (Bezet) 12/21/2017 418  ms Preliminary    P Axis 12/21/2017 76  degrees Preliminary    R Axis 12/21/2017 -45  degrees Preliminary    T Axis 12/21/2017 41  degrees Preliminary       US Abdomen Complete    Result Date: 12/27/2017  HISTORY: 63 year old male with morbid obesity, preoperative exam prior to bariatric surgery. FINDINGS: Exam slightly limited due to patient body habitus. Liver: Top normal size measuring up to 16.5 cm, normal contour and echotexture with no detectable lesion. Pancreas: Normal. Gallbladder: Normal. No gallstone or sonographic Murphy's sign. Bile ducts: Normal. Common duct measures 2 mm. Spleen: Normal measuring up to 8.1 cm. Aorta: Normal caliber. IVC: Visualized IVC is unremarkable. Free fluid: None. Kidneys: Normal size and echotexture with no obstruction. Right kidney measures 10.9 cm in length. Left kidney measures 11.8 cm in length. There is a 5.4 x 4.7 x 5.0 cm left renal cortical simple cyst.     Left  renal cortical cyst measuring up to 5.4 cm. Otherwise unremarkable exam. Adela Glimpse, MD 12/27/2017 2:10 PM  .   Sharni Negron R. Shemaiah Round,  DO, FACS, FASMBS, FACOS

## 2018-02-04 ENCOUNTER — Encounter (HOSPITAL_BASED_OUTPATIENT_CLINIC_OR_DEPARTMENT_OTHER): Payer: Self-pay | Admitting: Surgery

## 2018-02-07 ENCOUNTER — Encounter (HOSPITAL_BASED_OUTPATIENT_CLINIC_OR_DEPARTMENT_OTHER): Payer: Self-pay

## 2018-02-22 ENCOUNTER — Ambulatory Visit (HOSPITAL_BASED_OUTPATIENT_CLINIC_OR_DEPARTMENT_OTHER): Payer: BLUE CROSS/BLUE SHIELD

## 2018-03-29 ENCOUNTER — Ambulatory Visit (HOSPITAL_BASED_OUTPATIENT_CLINIC_OR_DEPARTMENT_OTHER): Payer: BLUE CROSS/BLUE SHIELD | Admitting: Surgery

## 2018-04-09 ENCOUNTER — Encounter (HOSPITAL_BASED_OUTPATIENT_CLINIC_OR_DEPARTMENT_OTHER): Payer: Self-pay | Admitting: Surgery

## 2018-04-09 ENCOUNTER — Other Ambulatory Visit (HOSPITAL_BASED_OUTPATIENT_CLINIC_OR_DEPARTMENT_OTHER): Payer: Self-pay | Admitting: Surgery

## 2018-04-09 ENCOUNTER — Encounter (INDEPENDENT_AMBULATORY_CARE_PROVIDER_SITE_OTHER): Payer: Self-pay

## 2018-04-09 DIAGNOSIS — I10 Essential (primary) hypertension: Secondary | ICD-10-CM

## 2018-04-09 DIAGNOSIS — Z9884 Bariatric surgery status: Secondary | ICD-10-CM

## 2018-04-09 MED ORDER — AMLODIPINE BESYLATE 5 MG PO TABS
5.0000 mg | ORAL_TABLET | Freq: Every day | ORAL | 1 refills | Status: DC
Start: 2018-04-09 — End: 2018-09-03

## 2018-04-10 ENCOUNTER — Encounter (HOSPITAL_BASED_OUTPATIENT_CLINIC_OR_DEPARTMENT_OTHER): Payer: Self-pay | Admitting: Surgery

## 2018-04-10 DIAGNOSIS — Z9884 Bariatric surgery status: Secondary | ICD-10-CM

## 2018-04-19 ENCOUNTER — Ambulatory Visit (HOSPITAL_BASED_OUTPATIENT_CLINIC_OR_DEPARTMENT_OTHER): Payer: BLUE CROSS/BLUE SHIELD | Admitting: Family

## 2018-04-26 ENCOUNTER — Encounter (HOSPITAL_BASED_OUTPATIENT_CLINIC_OR_DEPARTMENT_OTHER): Payer: Self-pay | Admitting: Surgery

## 2018-04-26 ENCOUNTER — Telehealth (INDEPENDENT_AMBULATORY_CARE_PROVIDER_SITE_OTHER): Payer: BLUE CROSS/BLUE SHIELD | Admitting: Surgery

## 2018-04-26 VITALS — Ht 68.0 in | Wt 254.0 lb

## 2018-04-26 DIAGNOSIS — Z9884 Bariatric surgery status: Secondary | ICD-10-CM

## 2018-04-26 MED ORDER — URSODIOL 300 MG PO CAPS
300.00 mg | ORAL_CAPSULE | Freq: Two times a day (BID) | ORAL | 5 refills | Status: AC
Start: 2018-04-26 — End: 2018-10-23

## 2018-04-26 NOTE — Progress Notes (Signed)
Assessment:  Status Post robotic laparoscopic RNY-Gastric Bypass      Plan:  1.  Patient is to continue with portion control  2.  Patient is to increase exercise.  3.  Patient is to continue with supplements.  4.  Patient is to follow up with PCP for adjustment of his blood pressure medications.  Patient is to check with his sleep doctor for CPAP adjustment or discontinuation.  5.  Patient is to follow up in 3 month(s) with labs.    Verbal consent has been obtained from the patient to conduct a video and telephone visit to minimize exposure to COVID-19: yes        Subjective:  [name]. Kurt Bates returns for follow up 3 month(s) post laparoscopic RNY-Gastric Bypass. He is doing well with 77 Lbs. total weight loss. He is tolerating a regular diet and denies any nausea, vomiting, reflux or abdominal pain.  He is compliant with portion control and vitamin supplementation. His energy level is good. He is exercising regularly.  Lab work was reviewed.    Objective:  Height 5\' 8" , weight 254 lb.     The following portions of the patient's history were reviewed and updated as appropriate: allergies, current medications, past family history, past medical history, past social history, past surgical history and problem list.          No visits with results within 3 Month(s) from this visit.   Latest known visit with results is:   Admission on 01/14/2018, Discharged on 01/15/2018   Component Date Value Ref Range Status    ABO Rh 01/14/2018 A POS   Final    AB Screen Gel 01/14/2018 NEG   Final    POCT - Glucose Whole blood 01/14/2018 84  70 - 100 mg/dL Final    Glucose 16/10/9602 101* 70 - 100 mg/dL Final    Comment: ADA guidelines for diabetes mellitus:  Fasting:  Equal to or greater than 126 mg/dL  Random:   Equal to or greater than 200 mg/dL      BUN 54/09/8117 16  9 - 28 mg/dL Final    Creatinine 14/78/2956 1.2  0.7 - 1.3 mg/dL Final    Calcium 21/30/8657 9.3  8.5 - 10.5 mg/dL Final    Sodium 84/69/6295  137  136 - 145 mEq/L Final    Potassium 01/15/2018 4.7  3.5 - 5.1 mEq/L Final    Chloride 01/15/2018 104  100 - 111 mEq/L Final    CO2 01/15/2018 27  22 - 29 mEq/L Final    Anion Gap 01/15/2018 6.0  5.0 - 15.0 Final    WBC 01/15/2018 7.90  3.10 - 9.50 x10 3/uL Final    Hgb 01/15/2018 12.9  12.5 - 17.1 g/dL Final    Hematocrit 28/41/3244 41.2  37.6 - 49.6 % Final    Platelets 01/15/2018 215  142 - 346 x10 3/uL Final    RBC 01/15/2018 4.59  4.20 - 5.90 x10 6/uL Final    MCV 01/15/2018 89.8  78.0 - 96.0 fL Final    MCH 01/15/2018 28.1  25.1 - 33.5 pg Final    MCHC 01/15/2018 31.3* 31.5 - 35.8 g/dL Final    RDW 01/11/7251 13  11 - 15 % Final    MPV 01/15/2018 9.5  8.9 - 12.5 fL Final    Nucleated RBC 01/15/2018 0.0  0.0 - 0.0 /100 WBC Final    Absolute NRBC 01/15/2018 0.00  0.00 - 0.00 x10 3/uL Final  Magnesium 01/15/2018 1.7  1.6 - 2.6 mg/dL Final    Phosphorus 16/10/9602 3.1  2.3 - 4.7 mg/dL Final    Creatine Kinase (CK) 01/15/2018 422* 47 - 267 U/L Final    POCT - Glucose Whole blood 01/15/2018 106* 70 - 100 mg/dL Final    EGFR 54/09/8117 >60.0   Final    Comment: Disease State Reference Ranges:    Chronic Kidney Disease; < 60 ml/min/1.73 sq.m    Kidney Failure; < 15 ml/min/1.73 sq.m    [Calculated using IDMS-Traceable MDRD equation (based on    gender, age and black vs. non-black race) recommended by    [employer] Kidney Disease Education Program. No data    available for non-white, non-black race.]  GFR estimates are unreliable in patients with:    Rapidly changing kidney function or recent dialysis,    extreme age, body size or body composition(obesity,    severe malnutrition). Abnormal muscle mass (limb    amputation, muscle wasting). In these patients,    alternative determinations of GFR should be obtained.      POCT - Glucose Whole blood 01/15/2018 101* 70 - 100 mg/dL Final    POCT - Glucose Whole blood 01/15/2018 101* 70 - 100 mg/dL Final       No results found..         No results  found.      This note was generated by the [facility] EMR system/Dragon speech recognition and may contain inherent errors or omissions not intended by the user. Grammatical errors, random word insertions, deletions, pronoun errors and incomplete sentences are occasional consequences of this technology due to software limitations. Not all errors are caught or corrected. If there are questions or concerns about the content of this note or information contained within the body of this dictation they should be addressed directly with the author for clarification.    Glenice Ciccone R. Candice Tobey, DO, FACS, FASMBS, FACOS

## 2018-07-10 ENCOUNTER — Encounter (HOSPITAL_BASED_OUTPATIENT_CLINIC_OR_DEPARTMENT_OTHER): Payer: Self-pay | Admitting: Surgery

## 2018-07-10 DIAGNOSIS — Z9884 Bariatric surgery status: Secondary | ICD-10-CM

## 2018-09-03 ENCOUNTER — Telehealth (INDEPENDENT_AMBULATORY_CARE_PROVIDER_SITE_OTHER): Payer: BLUE CROSS/BLUE SHIELD | Admitting: Family

## 2018-09-03 ENCOUNTER — Encounter (HOSPITAL_BASED_OUTPATIENT_CLINIC_OR_DEPARTMENT_OTHER): Payer: Self-pay | Admitting: Family

## 2018-09-03 VITALS — Ht 68.0 in | Wt 225.0 lb

## 2018-09-03 DIAGNOSIS — I10 Essential (primary) hypertension: Secondary | ICD-10-CM

## 2018-09-03 DIAGNOSIS — Z719 Counseling, unspecified: Secondary | ICD-10-CM

## 2018-09-03 DIAGNOSIS — E669 Obesity, unspecified: Secondary | ICD-10-CM

## 2018-09-03 DIAGNOSIS — Z713 Dietary counseling and surveillance: Secondary | ICD-10-CM

## 2018-09-03 DIAGNOSIS — Z9884 Bariatric surgery status: Secondary | ICD-10-CM

## 2018-09-03 NOTE — Progress Notes (Signed)
Video Progress Note    Patient Name: Kurt Bates, Kurt JR.  Age: 63 y.o.  Sex: male   DOB: January 15, 1955  MRN: [id number]    Verbal consent has been obtained from the patient to conduct a video and telephone visit to minimize exposure to COVID-19: yes.  Platform utilized: VidyoConnect  Total time spent: 20 min     Subjective:   Kurt Bates. returns for follow up 7 months post Laparoscopic Roux-en-Y Gastric Bypass. He is tolerating a regular diet and denies any nausea, vomiting, reflux, or abdominal pain. He is compliant with vitamin supplementation and portion sizes.  He is drinking adequate fluids and protein supplements daily. His energy level is good. He is exercising regularly. He is very happy with his weight loss to date. He has had 106 lbs total weight loss.     Past Medical History:     Past Medical History:   Diagnosis Date    Gout     Hypertension     Sleep apnea     CPAP       Past Surgical History:     Past Surgical History:   Procedure Laterality Date    APPENDECTOMY      EGD, COLONOSCOPY N/A 05/07/2017    Procedure: EGD, COLONOSCOPY;  Surgeon: Annalee Genta, MD;  Location: [facility];  Service: Gastroenterology;  Laterality: N/A;  EGD, COLONOSCOPY  Q1=UNK    INSERTION, PAIN PUMP (MEDICAL)  01/14/2018    Procedure: INSERTION, PAIN PUMP (MEDICAL);  Surgeon: Nicola Police, DO;  Location: [place] [facility];  Service: General;;    REPLACEMENT TOTAL KNEE BILATERAL      ROBOT XI ASSISTED, LAPAROCOPIC, GASTRIC BYPASS N/A 01/14/2018    Procedure: ROBOT XI ASSISTED, LAPAROSCOPIC, ROUX-EN-Y GASTRIC BYPASS;  Surgeon: Nicola Police, DO;  Location: [place] [facility];  Service: General;  Laterality: N/A;    ROBOT XI ASSISTED,LAPAROSCOPIC,HERNIA REPAIR,HIATAL  01/14/2018    Procedure: ROBOT XI ASSISTED, LAPAROSCOPIC, HERNIA REPAIR, HIATAL;  Surgeon: Nicola Police, DO;  Location: [facility];  Service: General;;       Family History:     Family History   Problem Relation Age of  Onset    Cancer Father        Allergies:   No Known Allergies    Medications:     Prior to Admission medications    Medication Sig Start Date End Date Taking? Authorizing Provider   valsartan (DIOVAN) 80 MG tablet  06/25/18  Yes [provider]   acetaminophen (TYLENOL) 160 MG/5ML solution Take 20.3 mLs (650 mg total) by mouth every 4 (four) hours 01/15/18   Deters, Kaitlin V, DO   amLODIPine (NORVASC) 5 MG tablet Take 1 tablet (5 mg total) by mouth daily 04/09/18 10/06/18  Lonna Cobb, NP   enoxaparin (LOVENOX) 40 MG/0.4ML Solution Inject 0.4 mLs (40 mg total) into the skin every 12 (twelve) hours 01/15/18   Deters, Kaitlin V, DO   gabapentin (NEURONTIN) 300 MG capsule Take 1 capsule (300 mg total) by mouth every 8 (eight) hours 01/15/18   Deters, Kaitlin V, DO   ursodiol (ACTIGALL) 300 MG capsule Take 1 capsule (300 mg total) by mouth 2 (two) times daily 04/26/18 10/23/18  Pourshojae, Hamid R, DO       Review of Systems:     Constitutional: negative for fevers, night sweats  Head and neck: negative for visual changes, eye pain, dry mouth, or hearing impairment  Respiratory: negative  for SOB, cough  Cardiovascular: negative for chest pain, palpitations  Gastrointestinal: negative for abdominal pain or bloating  Genitourinary: negative for hematuria, dysuria  Musculoskeletal: negative for bone pain, myalgias and stiff joints  Skin: no rash, no itching, no wounds  Neurological: negative for dizziness, gait problems, headaches, or memory problems  Behavioral/Psych: negative for fatigue, loss of interest in favorite activities, sleep disturbance  Endocrine: negative for temperature intolerance      Physical Exam:   There were no vitals filed for this visit.  BMI: Body mass index is 34.21 kg/m.  Previous Weight:   Wt Readings from Last 15 Encounters:   09/03/18 225 lb   04/26/18 254 lb   01/25/18 311 lb   01/25/18 311 lb   01/14/18 321 lb   01/11/18 321 lb   01/11/18 321 lb   12/21/17 329 lb   11/30/17 326 lb    10/19/17 331 lb 3.2 oz   09/11/17 322 lb   08/28/17 321 lb   05/07/17 303 lb   12/26/13 303 lb   10/08/13 299 lb        Appearance: comfortable, no acute distress, well nourished  HEENT: normocephalic, clear conjunctiva   Chest: breathing unlabored, speaks in full sentences  Neuro: alert and oriented x 3  Psych: calm and cooperative     Labs Reviewed:     Lab Results   Component Value Date    WBC 7.90 01/15/2018    HGB 12.9 01/15/2018    HCT 41.2 01/15/2018    MCV 89.8 01/15/2018    PLT 215 01/15/2018     '  Chemistry        Component Value Date/Time    NA 137 01/15/2018 0633    K 4.7 01/15/2018 0633    CL 104 01/15/2018 0633    CO2 27 01/15/2018 0633    BUN 16 01/15/2018 0633    CREAT 1.2 01/15/2018 0633    GLU 101 (H) 01/15/2018 0633        Component Value Date/Time    CA 9.3 01/15/2018 0633          No results found for: ALT, AST, GGT, ALKPHOS, BILITOTAL  Lab Results   Component Value Date    VITD 16 10/08/2013    VITD <4 10/08/2013     No results found for: B12  No results found for: VITAMINB1  No results found for: VITAMINARETI  No results found for: CHOL, TRIG, HDL, CHOLHDLRATIO  No results found for: TSH  Lab Results   Component Value Date    CA 9.3 01/15/2018    PHOS 3.1 01/15/2018     No results found for: IRON, TIBC, FERRITIN  No results found for: HGBA1C    Rads:   Radiological Procedure reviewed.    Radiology Results (24 Hour)     ** No results found for the last 24 hours. **          Assessment and Plan:   7 months post Laparoscopic Roux-en-Y Gastric Bypass and the patient has lost 106 lbs.  He is doing well with  reported dietary compliance.     Labs were reviewed from March. We will continue to monitor.     1. Continue monitoring and measuring portions. Continue protein from supplements daily and continue fluid intake for a minimum of 64 oz daily.   2. Continue exercise as tolerated.  3. Follow up with PCP regularly.   4. Medications reviewed. Continue vitamins with any changes  as noted above.    5.  Routine care in 3+ months with labs or sooner if needed.     Over half, 50%, of the time of face to face interaction was spent discussing and counseling the patient on their bariatric follow up, weight loss, life style modification, post operative and life long management concerns.     Signed by: Lonna Cobb, FNP-BC    This note was generated by the [facility] EMR system/Dragon speech recognition and may contain inherent errors or omissions not intended by the user. Grammatical errors, random word insertions, deletions, pronoun errors and incomplete sentences are occasional consequences of this technology due to software limitations. Not all errors are caught or corrected. If there are questions or concerns about the content of this note or information contained within the body of this dictation they should be addressed directly with the author for clarification.

## 2018-12-30 ENCOUNTER — Telehealth (HOSPITAL_BASED_OUTPATIENT_CLINIC_OR_DEPARTMENT_OTHER): Payer: BLUE CROSS/BLUE SHIELD | Admitting: Family

## 2019-01-13 ENCOUNTER — Telehealth (INDEPENDENT_AMBULATORY_CARE_PROVIDER_SITE_OTHER): Payer: BLUE CROSS/BLUE SHIELD | Admitting: Family

## 2019-01-13 VITALS — Ht 68.0 in | Wt 213.0 lb

## 2019-01-13 DIAGNOSIS — Z713 Dietary counseling and surveillance: Secondary | ICD-10-CM

## 2019-01-13 DIAGNOSIS — E669 Obesity, unspecified: Secondary | ICD-10-CM

## 2019-01-13 DIAGNOSIS — Z719 Counseling, unspecified: Secondary | ICD-10-CM

## 2019-01-13 DIAGNOSIS — Z9884 Bariatric surgery status: Secondary | ICD-10-CM

## 2019-01-13 DIAGNOSIS — Z1321 Encounter for screening for nutritional disorder: Secondary | ICD-10-CM

## 2019-01-13 NOTE — Progress Notes (Addendum)
Video Progress Note    Patient Name: Kurt Bates, Kurt JR.  Age: 64 y.o.  Sex: male   DOB: 10-Jan-1955  MRN: [id number]    Verbal consent has been obtained from the patient to conduct a video and telephone visit to minimize exposure to COVID-19: yes.  Platform utilized: VidyoConnect  Total time spent: 20 min    Subjective:   Kurt Bates. returns for follow up 12 months post Laparoscopic Roux-en-Y Gastric Bypass. He is tolerating a regular diet and denies any nausea, vomiting, reflux, or abdominal pain. He complains of excessive flatus but is unsure what the cause is. He is compliant with vitamin supplementation. Portion sizes are unclear since he does not measure despite being advised to do so.  He is drinking adequate fluids daily. He does not drink protein supplements since the ones he has tried have caused him diarrhea. His energy level is good. He is very happy with his weight loss to date. He has had 118 lbs total weight loss.     Past Medical History:     Past Medical History:   Diagnosis Date    Gout     Hypertension     Sleep apnea     CPAP       Past Surgical History:     Past Surgical History:   Procedure Laterality Date    APPENDECTOMY      EGD, COLONOSCOPY N/A 05/07/2017    Procedure: EGD, COLONOSCOPY;  Surgeon: Annalee Genta, MD;  Location: [facility];  Service: Gastroenterology;  Laterality: N/A;  EGD, COLONOSCOPY  Q1=UNK    INSERTION, PAIN PUMP (MEDICAL)  01/14/2018    Procedure: INSERTION, PAIN PUMP (MEDICAL);  Surgeon: Nicola Police, DO;  Location: [place] [facility];  Service: General;;    REPLACEMENT TOTAL KNEE BILATERAL      ROBOT XI ASSISTED, LAPAROCOPIC, GASTRIC BYPASS N/A 01/14/2018    Procedure: ROBOT XI ASSISTED, LAPAROSCOPIC, ROUX-EN-Y GASTRIC BYPASS;  Surgeon: Nicola Police, DO;  Location: [place] [facility];  Service: General;  Laterality: N/A;    ROBOT XI ASSISTED,LAPAROSCOPIC,HERNIA REPAIR,HIATAL  01/14/2018    Procedure: ROBOT XI ASSISTED, LAPAROSCOPIC,  HERNIA REPAIR, HIATAL;  Surgeon: Nicola Police, DO;  Location: [place] [facility];  Service: General;;       Family History:     Family History   Problem Relation Age of Onset    Cancer Father        Allergies:   No Known Allergies    Medications:     Prior to Admission medications    Medication Sig Start Date End Date Taking? Authorizing Provider   Calcium Carbonate-Vitamin D (CALCIUM 500/D PO) Take by mouth   Yes [provider]   iron-vitamin C (Vitron-C) 65-125 MG Tab Take by mouth   Yes [provider]   Multiple Vitamin (MULTI-VITAMIN DAILY PO) Take by mouth   Yes [provider]   Multiple Vitamins-Minerals (ZINC PO) Take by mouth   Yes [provider]   valsartan (DIOVAN) 160 MG tablet Take 160 mg by mouth daily 12/24/18  Yes [provider]   vitamin B-12 (CYANOCOBALAMIN) 1000 MCG tablet Take 1,000 mcg by mouth daily   Yes [provider]   valsartan (DIOVAN) 80 MG tablet Take 160 mg by mouth daily   06/25/18 01/13/19  [provider]       Review of Systems:     Constitutional: negative for fevers, night sweats  Head  and neck: negative for visual changes, eye pain, dry mouth, or hearing impairment  Respiratory: negative for SOB, cough  Cardiovascular: negative for chest pain, palpitations  Gastrointestinal: negative for abdominal pain or bloating  Genitourinary: negative for hematuria, dysuria  Musculoskeletal: negative for bone pain, myalgias and stiff joints  Skin: no rash, no itching, no wounds  Neurological: negative for dizziness, gait problems, headaches, or memory problems  Behavioral/Psych: negative for fatigue, loss of interest in favorite activities, sleep disturbance  Endocrine: negative for temperature intolerance      Physical Exam:   There were no vitals filed for this visit.  BMI: Body mass index is 32.39 kg/m.  Previous Weight:   Wt Readings from Last 15 Encounters:   01/13/19 213 lb   09/03/18 225 lb   04/26/18 254 lb    01/25/18 311 lb   01/25/18 311 lb   01/14/18 321 lb   01/11/18 321 lb   01/11/18 321 lb   12/21/17 329 lb   11/30/17 326 lb   10/19/17 331 lb 3.2 oz   09/11/17 322 lb   08/28/17 321 lb   05/07/17 303 lb   12/26/13 303 lb        Appearance: comfortable, no acute distress, well nourished  HEENT: normocephalic, clear conjunctiva   Chest: breathing unlabored, speaks in full sentences  Neuro: alert and oriented x 3  Psych: calm and cooperative     Labs Reviewed:     Lab Results   Component Value Date    WBC 7.90 01/15/2018    HGB 12.9 01/15/2018    HCT 41.2 01/15/2018    MCV 89.8 01/15/2018    PLT 215 01/15/2018     '  Chemistry        Component Value Date/Time    NA 137 01/15/2018 0633    K 4.7 01/15/2018 0633    CL 104 01/15/2018 0633    CO2 27 01/15/2018 0633    BUN 16 01/15/2018 0633    CREAT 1.2 01/15/2018 0633    GLU 101 (H) 01/15/2018 0633        Component Value Date/Time    CA 9.3 01/15/2018 0633          No results found for: ALT, AST, GGT, ALKPHOS, BILITOTAL  Lab Results   Component Value Date    VITD 16 10/08/2013    VITD <4 10/08/2013     No results found for: B12  No results found for: VITAMINB1  No results found for: VITAMINARETI  No results found for: CHOL, TRIG, HDL, CHOLHDLRATIO  No results found for: TSH  Lab Results   Component Value Date    CA 9.3 01/15/2018    PHOS 3.1 01/15/2018     No results found for: IRON, TIBC, FERRITIN  No results found for: HGBA1C    Rads:   Radiological Procedure reviewed.    Radiology Results (24 Hour)     ** No results found for the last 24 hours. **          Assessment and Plan:   12 months post Laparoscopic Roux-en-Y Gastric Bypass and the patient has lost 118 lbs.  He is doing well with  his weight loss but his reported dietary compliance is concerning as he is not measuring and was advised to prevent regain that he should at a minimum try to measure his portions intermittently.     Labs were not reviewed. Plan for labs now and will follow up on MyChart  with results.  We will continue to monitor.      1. Begin monitoring and measuring portions. Continue fluid intake for a minimum of 64 oz daily.   2. Increase exercise as tolerated.  3. Follow up with PCP regularly.   4. Medications reviewed. Continue vitamins with any changes as noted above.    5. Routine care in 6 months with labs or sooner if needed.     Over half, 50%, of the time of face to face interaction was spent discussing and counseling the patient on their bariatric follow up, weight loss, life style modification, post operative and life long management concerns.     Signed by: Lonna Cobb, FNP-BC    This note was generated by the [facility] EMR system/Dragon speech recognition and may contain inherent errors or omissions not intended by the user. Grammatical errors, random word insertions, deletions, pronoun errors and incomplete sentences are occasional consequences of this technology due to software limitations. Not all errors are caught or corrected. If there are questions or concerns about the content of this note or information contained within the body of this dictation they should be addressed directly with the author for clarification.

## 2019-01-14 ENCOUNTER — Encounter (HOSPITAL_BASED_OUTPATIENT_CLINIC_OR_DEPARTMENT_OTHER): Payer: Self-pay | Admitting: Family

## 2019-01-14 ENCOUNTER — Encounter (HOSPITAL_BASED_OUTPATIENT_CLINIC_OR_DEPARTMENT_OTHER): Payer: Self-pay

## 2019-01-14 ENCOUNTER — Other Ambulatory Visit (HOSPITAL_BASED_OUTPATIENT_CLINIC_OR_DEPARTMENT_OTHER): Payer: Self-pay | Admitting: Family

## 2019-01-14 DIAGNOSIS — I10 Essential (primary) hypertension: Secondary | ICD-10-CM

## 2019-01-14 DIAGNOSIS — Z713 Dietary counseling and surveillance: Secondary | ICD-10-CM

## 2019-01-14 DIAGNOSIS — Z9884 Bariatric surgery status: Secondary | ICD-10-CM

## 2019-01-14 DIAGNOSIS — K9089 Other intestinal malabsorption: Secondary | ICD-10-CM

## 2019-01-14 DIAGNOSIS — E669 Obesity, unspecified: Secondary | ICD-10-CM

## 2019-01-14 DIAGNOSIS — Z1321 Encounter for screening for nutritional disorder: Secondary | ICD-10-CM

## 2019-01-17 LAB — CBC
Hematocrit: 37.7 % (ref 37.5–51.0)
Hemoglobin: 12.3 g/dL — ABNORMAL LOW (ref 13.0–17.7)
MCH: 29.4 pg (ref 26.6–33.0)
MCHC: 32.6 g/dL (ref 31.5–35.7)
MCV: 90 fL (ref 79–97)
Platelets: 232 10*3/uL (ref 150–450)
RBC: 4.19 x10E6/uL (ref 4.14–5.80)
RDW: 13.2 % (ref 11.6–15.4)
WBC: 4.6 10*3/uL (ref 3.4–10.8)

## 2019-01-17 LAB — COMPREHENSIVE METABOLIC PANEL
ALT: 30 IU/L (ref 0–44)
AST (SGOT): 38 IU/L (ref 0–40)
Albumin/Globulin Ratio: 1.8 (ref 1.2–2.2)
Albumin: 4.1 g/dL (ref 3.8–4.8)
Alkaline Phosphatase: 98 IU/L (ref 39–117)
BUN / Creatinine Ratio: 14 (ref 10–24)
BUN: 15 mg/dL (ref 8–27)
Bilirubin, Total: 0.5 mg/dL (ref 0.0–1.2)
CO2: 23 mmol/L (ref 20–29)
Calcium: 9.5 mg/dL (ref 8.6–10.2)
Chloride: 109 mmol/L — ABNORMAL HIGH (ref 96–106)
Creatinine: 1.1 mg/dL (ref 0.76–1.27)
EGFR: 71 mL/min/{1.73_m2} (ref >59)
EGFR: 82 mL/min/{1.73_m2} (ref >59)
Globulin, Total: 2.3 g/dL (ref 1.5–4.5)
Glucose: 83 mg/dL (ref 65–99)
Potassium: 4.4 mmol/L (ref 3.5–5.2)
Protein, Total: 6.4 g/dL (ref 6.0–8.5)
Sodium: 142 mmol/L (ref 134–144)

## 2019-01-17 LAB — PTH, INTACT: PARATHYROID HORMONE INTACT: 54 pg/mL (ref 15–65)

## 2019-01-17 LAB — LIPID PANEL
Cholesterol / HDL Ratio: 1.5 ratio (ref 0.0–5.0)
Cholesterol: 121 mg/dL (ref 100–199)
HDL: 81 mg/dL (ref >39)
LDL Chol Calculated (NIH): 28 mg/dL (ref 0–99)
Triglycerides: 48 mg/dL (ref 0–149)
VLDL Calculated: 12 mg/dL (ref 5–40)

## 2019-01-17 LAB — HEMOGLOBIN A1C: Hemoglobin A1C: 5.3 % (ref 4.8–5.6)

## 2019-01-17 LAB — IRON PROFILE
Iron Saturation: 33 % (ref 15–55)
Iron: 101 ug/dL (ref 38–169)
TIBC: 304 ug/dL (ref 250–450)
UIBC: 203 ug/dL (ref 111–343)

## 2019-01-17 LAB — FOLATE: Folate: 11 ng/mL (ref >3.0)

## 2019-01-17 LAB — VITAMIN B12: Vitamin B-12: 923 pg/mL (ref 232–1245)

## 2019-01-17 LAB — VITAMIN D,25 OH,TOTAL: Vitamin D 25-Hydroxy: 21.5 ng/mL — ABNORMAL LOW (ref 30.0–100.0)

## 2019-01-19 LAB — VITAMIN A: Vitamin A: 26 ug/dL (ref 22.0–69.5)

## 2019-01-22 LAB — COPPER, SERUM: Copper: 105 ug/dL (ref 69–132)

## 2019-01-22 LAB — ZINC: Zinc: 73 ug/dL (ref 44–115)

## 2019-01-23 ENCOUNTER — Encounter (HOSPITAL_BASED_OUTPATIENT_CLINIC_OR_DEPARTMENT_OTHER): Payer: Self-pay | Admitting: Family

## 2019-01-23 ENCOUNTER — Other Ambulatory Visit (HOSPITAL_BASED_OUTPATIENT_CLINIC_OR_DEPARTMENT_OTHER): Payer: Self-pay | Admitting: Family

## 2019-01-23 DIAGNOSIS — Z9884 Bariatric surgery status: Secondary | ICD-10-CM

## 2019-01-23 DIAGNOSIS — Z1321 Encounter for screening for nutritional disorder: Secondary | ICD-10-CM

## 2019-01-23 DIAGNOSIS — E669 Obesity, unspecified: Secondary | ICD-10-CM

## 2019-01-23 DIAGNOSIS — Z713 Dietary counseling and surveillance: Secondary | ICD-10-CM

## 2019-01-23 DIAGNOSIS — E569 Vitamin deficiency, unspecified: Secondary | ICD-10-CM

## 2019-01-23 DIAGNOSIS — E559 Vitamin D deficiency, unspecified: Secondary | ICD-10-CM

## 2019-01-23 DIAGNOSIS — K9089 Other intestinal malabsorption: Secondary | ICD-10-CM

## 2019-01-23 LAB — VITAMIN B1, WHOLE BLOOD: Whole Blood Vitamin B1: 91.2 nmol/L (ref 66.5–200.0)

## 2019-07-10 ENCOUNTER — Encounter (HOSPITAL_BASED_OUTPATIENT_CLINIC_OR_DEPARTMENT_OTHER): Payer: Self-pay | Admitting: Family

## 2019-07-10 DIAGNOSIS — K9089 Other intestinal malabsorption: Secondary | ICD-10-CM

## 2019-07-10 DIAGNOSIS — E559 Vitamin D deficiency, unspecified: Secondary | ICD-10-CM

## 2019-07-10 DIAGNOSIS — E569 Vitamin deficiency, unspecified: Secondary | ICD-10-CM

## 2019-07-10 DIAGNOSIS — Z1321 Encounter for screening for nutritional disorder: Secondary | ICD-10-CM

## 2019-07-10 DIAGNOSIS — Z713 Dietary counseling and surveillance: Secondary | ICD-10-CM

## 2019-07-10 DIAGNOSIS — Z9884 Bariatric surgery status: Secondary | ICD-10-CM

## 2019-07-10 DIAGNOSIS — E669 Obesity, unspecified: Secondary | ICD-10-CM

## 2019-08-09 ENCOUNTER — Emergency Department (HOSPITAL_COMMUNITY): Payer: BLUE CROSS/BLUE SHIELD

## 2019-08-09 ENCOUNTER — Encounter (HOSPITAL_COMMUNITY): Payer: Self-pay

## 2019-08-09 ENCOUNTER — Emergency Department (HOSPITAL_COMMUNITY)
Admission: EM | Admit: 2019-08-09 | Discharge: 2019-08-10 | Disposition: A | Discharge status: Home / Self Care | Payer: BLUE CROSS/BLUE SHIELD | Attending: Emergency Medicine | Admitting: Emergency Medicine

## 2019-08-09 ENCOUNTER — Other Ambulatory Visit: Payer: Self-pay

## 2019-08-09 DIAGNOSIS — R42 Dizziness and giddiness: Secondary | ICD-10-CM | POA: Diagnosis not present

## 2019-08-09 DIAGNOSIS — Z0189 Encounter for other specified special examinations: Secondary | ICD-10-CM

## 2019-08-09 DIAGNOSIS — R200 Anesthesia of skin: Secondary | ICD-10-CM | POA: Diagnosis present

## 2019-08-09 DIAGNOSIS — G459 Transient cerebral ischemic attack, unspecified: Secondary | ICD-10-CM

## 2019-08-09 DIAGNOSIS — I10 Essential (primary) hypertension: Secondary | ICD-10-CM | POA: Insufficient documentation

## 2019-08-09 DIAGNOSIS — Z1389 Encounter for screening for other disorder: Secondary | ICD-10-CM

## 2019-08-09 LAB — CBC WITH DIFFERENTIAL/PLATELET
Abs Immature Granulocytes: 0.01 10*3/uL (ref 0.00–0.07)
Basophils Absolute: 0 10*3/uL (ref 0.0–0.1)
Basophils Relative: 1 %
Eosinophils Absolute: 0.1 10*3/uL (ref 0.0–0.5)
Eosinophils Relative: 2 %
HCT: 40.3 % (ref 39.0–52.0)
Hemoglobin: 12.7 g/dL — ABNORMAL LOW (ref 13.0–17.0)
Immature Granulocytes: 0 %
Lymphocytes Relative: 45 %
Lymphs Abs: 2.9 10*3/uL (ref 0.7–4.0)
MCH: 29.9 pg (ref 26.0–34.0)
MCHC: 31.5 g/dL (ref 30.0–36.0)
MCV: 94.8 fL (ref 80.0–100.0)
Monocytes Absolute: 0.5 10*3/uL (ref 0.1–1.0)
Monocytes Relative: 8 %
Neutro Abs: 2.8 10*3/uL (ref 1.7–7.7)
Neutrophils Relative %: 44 %
Platelets: 202 10*3/uL (ref 150–400)
RBC: 4.25 MIL/uL (ref 4.22–5.81)
RDW: 14 % (ref 11.5–15.5)
WBC: 6.3 10*3/uL (ref 4.0–10.5)
nRBC: 0 % (ref 0.0–0.2)

## 2019-08-09 LAB — BASIC METABOLIC PANEL
Anion gap: 10 (ref 5–15)
BUN: 13 mg/dL (ref 8–23)
CO2: 25 mmol/L (ref 22–32)
Calcium: 9.1 mg/dL (ref 8.9–10.3)
Chloride: 104 mmol/L (ref 98–111)
Creatinine, Ser: 1.03 mg/dL (ref 0.61–1.24)
GFR calc Af Amer: >60 mL/min (ref >60)
GFR calc non Af Amer: >60 mL/min (ref >60)
Glucose, Bld: 97 mg/dL (ref 70–99)
Potassium: 4.4 mmol/L (ref 3.5–5.1)
Sodium: 139 mmol/L (ref 135–145)

## 2019-08-09 MED ORDER — SODIUM CHLORIDE (PF) 0.9 % IJ SOLN
INTRAMUSCULAR | Status: AC
  Filled 2019-08-09: qty 50

## 2019-08-09 MED ORDER — IOHEXOL 350 MG/ML SOLN
100.0000 mL | Freq: Once | INTRAVENOUS | Status: AC | PRN
  Administered 2019-08-09: 100 mL via INTRAVENOUS

## 2019-08-09 NOTE — ED Notes (Signed)
Patient going to [facility] for MRI. Charge nurse called and given report. Patient went by POV

## 2019-08-09 NOTE — ED Notes (Signed)
Patient ambulatory in triage.

## 2019-08-09 NOTE — ED Triage Notes (Signed)
Patient reports he was diagnosed with pinched nerve. Then states once a week for last 4 weeks his lips go numb and he feels light headed. Patient states he does not feel that way in triage.

## 2019-08-10 ENCOUNTER — Emergency Department (HOSPITAL_COMMUNITY): Payer: BLUE CROSS/BLUE SHIELD

## 2019-08-10 NOTE — ED Provider Notes (Signed)
Patient transferred from [facility] to have MRI to rule out stroke.  MRI has been read and there are no acute findings.  Plan was therefore to have patient discharged and outpatient follow-up with neurology and cardiology.   Gilda Crease, MD 08/10/19 [phone]

## 2019-08-10 NOTE — Discharge Instructions (Addendum)
Schedule follow-up with one of the listed neurology groups to further evaluate your symptoms.  Take a low-dose aspirin daily until follow-up.

## 2019-08-10 NOTE — ED Provider Notes (Signed)
[facility]-EMERGENCY DEPT Provider Note   CSN: [id number] Arrival date & time: 08/09/19  1442     History Chief Complaint  Patient presents with  . Numbness    Gene Lawrence is a 64 y.o. male.  HPI     64 year old male with history of hypertension comes in a chief complaint of numbness, dizziness described a spinning sensation and repetitive speech.  Patient was at [facility] when he had this episode that lasted for about 7 minutes.  The numbness was in his lip.  Patient states that he has had 3 other symptoms similar to this over the last 4 weeks.  This episode lasted the longest.  He had no associated headache, neck pain.  He has history of chronic neck pain with radiculopathy affecting his left upper extremity.  No new upper or lower extremity symptoms.  Patient denies any vision change.   History reviewed. No pertinent past medical history.  There are no problems to display for this patient.     No family history on file.  Social History   Tobacco Use  . Smoking status: Not on file  Substance Use Topics  . Alcohol use: Not on file  . Drug use: Not on file    Home Medications Prior to Admission medications   Not on File    Allergies    Patient has no allergy information on record.  Review of Systems   Review of Systems  Constitutional: Positive for activity change.  Respiratory: Negative for shortness of breath.   Cardiovascular: Negative for chest pain.  Gastrointestinal: Negative for nausea and vomiting.  Neurological: Positive for dizziness and numbness.  All other systems reviewed and are negative.   Physical Exam Updated Vital Signs BP (!) 149/91 (BP Location: Right Arm)   Pulse 60   Temp 97.9 F (36.6 C) (Oral)   Resp 18   Ht 5\' 8"  (1.727 m)   Wt (!) 99.8 kg   SpO2 100%   BMI 33.45 kg/m   Physical Exam Vitals and nursing note reviewed.  Constitutional:      Appearance: He is well-developed.  HENT:     Head:  Atraumatic.  Eyes:     Extraocular Movements: Extraocular movements intact.     Pupils: Pupils are equal, round, and reactive to light.  Cardiovascular:     Rate and Rhythm: Normal rate.  Pulmonary:     Effort: Pulmonary effort is normal.  Musculoskeletal:     Cervical back: Neck supple.     Right lower leg: No edema.     Left lower leg: No edema.  Skin:    General: Skin is warm.  Neurological:     Mental Status: He is alert and oriented to person, place, and time.     Cranial Nerves: No cranial nerve deficit.     ED Results / Procedures / Treatments   Labs (all labs ordered are listed, but only abnormal results are displayed) Labs Reviewed  CBC WITH DIFFERENTIAL/PLATELET - Abnormal; Notable for the following components:      Result Value   Hemoglobin 12.7 (*)    All other components within normal limits  BASIC METABOLIC PANEL    EKG EKG Interpretation  Date/Time:  Saturday August 09 2019 15:25:20 EDT Ventricular Rate:  53 PR Interval:    QRS Duration: 123 QT Interval:  457 QTC Calculation: 430 R Axis:   -39 Text Interpretation: Sinus rhythm Atrial premature complex Borderline short PR interval IVCD, consider atypical RBBB  No acute changes No old tracing to compare Confirmed by Derwood Kaplan [phone] on 08/09/2019 8:47:36 PM   Radiology CT Angio Head W or Wo Contrast  Result Date: 08/09/2019 CLINICAL DATA:  Stroke/TIA, assess intracranial arteries; stroke/TIA, assess extracranial arteries. Additional provided: Patient reports "pinched nerve" with lips going numb and feeling lightheaded once a week for the last 4 weeks. EXAM: CT ANGIOGRAPHY HEAD AND NECK TECHNIQUE: Multidetector CT imaging of the head and neck was performed using the standard protocol during bolus administration of intravenous contrast. Multiplanar CT image reconstructions and MIPs were obtained to evaluate the vascular anatomy. Carotid stenosis measurements (when applicable) are obtained utilizing NASCET  criteria, using the distal internal carotid diameter as the denominator. CONTRAST:  OMNIPAQUE IOHEXOL 350 MG/ML SOLN COMPARISON:  Noncontrast head CT performed earlier the same day 08/09/2019 FINDINGS: CTA NECK FINDINGS Aortic arch: Standard aortic branching. The visualized aortic arch is unremarkable. No hemodynamically significant innominate or proximal subclavian artery stenosis. Right carotid system: CCA and ICA patent within the neck without stenosis. No significant atherosclerotic disease. Left carotid system: CCA and ICA patent within the neck without stenosis. No significant atherosclerotic disease. Tortuosity of the distal cervical ICA. Vertebral arteries: The vertebral arteries are codominant and patent within the neck bilaterally without hemodynamically significant stenosis. Skeleton: Advanced cervical spondylosis with multilevel disc space narrowing, posterior disc osteophytes, uncovertebral and facet hypertrophy. There is multilevel bony neural foraminal narrowing. Mild/moderate bony spinal canal stenosis throughout the cervical spine. Other neck: 2.6 cm nodule within the left thyroid lobe. No cervical lymphadenopathy or soft tissue neck mass is identified. Upper chest: No consolidation within the imaged lung apices Review of the MIP images confirms the above findings CTA HEAD FINDINGS Anterior circulation: The intracranial internal carotid arteries are patent. Calcified plaque within both vessels without significant stenosis. The M1 middle cerebral arteries are patent without significant stenosis. No M2 proximal branch occlusion or high-grade proximal stenosis is identified. The anterior cerebral arteries are patent. No intracranial aneurysm is identified. Posterior circulation: The intracranial vertebral arteries are patent. The basilar artery is patent. The posterior cerebral arteries are patent. Sizable posterior communicating arteries are present bilaterally. Venous sinuses: Within  limitations of contrast timing, no convincing thrombus. Anatomic variants: None significant Review of the MIP images confirms the above findings IMPRESSION: CTA neck: 1. The common carotid, internal carotid and vertebral arteries are patent within the neck without hemodynamically significant stenosis. 2. 2.6 cm left thyroid lobe nodule. Nonemergent thyroid ultrasound is recommended for further evaluation. 3. Advanced cervical spondylosis as described. CTA head: 1. No intracranial large vessel occlusion or proximal high-grade arterial stenosis. 2. Mild calcified plaque within the intracranial internal carotid arteries bilaterally. Electronically Signed   By: Jackey Loge DO   On: 08/09/2019 22:43   CT Head Wo Contrast  Result Date: 08/09/2019 CLINICAL DATA:  Transient ischemic attack EXAM: CT HEAD WITHOUT CONTRAST TECHNIQUE: Contiguous axial images were obtained from the base of the skull through the vertex without intravenous contrast. COMPARISON:  None. FINDINGS: Brain: No evidence of acute infarction, hemorrhage, hydrocephalus, extra-axial collection or mass lesion/mass effect. Vascular: No hyperdense vessel or unexpected calcification. Skull: Normal. Negative for fracture or focal lesion. Sinuses/Orbits: No acute finding. Other: None. IMPRESSION: No acute intracranial abnormality noted. Electronically Signed   By: Alcide Clever M.D.   On: 08/09/2019 22:16   CT Angio Neck W and/or Wo Contrast  Result Date: 08/09/2019 CLINICAL DATA:  Stroke/TIA, assess intracranial arteries; stroke/TIA, assess extracranial arteries. Additional provided: Patient reports "pinched  nerve" with lips going numb and feeling lightheaded once a week for the last 4 weeks. EXAM: CT ANGIOGRAPHY HEAD AND NECK TECHNIQUE: Multidetector CT imaging of the head and neck was performed using the standard protocol during bolus administration of intravenous contrast. Multiplanar CT image reconstructions and MIPs were obtained to evaluate the  vascular anatomy. Carotid stenosis measurements (when applicable) are obtained utilizing NASCET criteria, using the distal internal carotid diameter as the denominator. CONTRAST:  OMNIPAQUE IOHEXOL 350 MG/ML SOLN COMPARISON:  Noncontrast head CT performed earlier the same day 08/09/2019 FINDINGS: CTA NECK FINDINGS Aortic arch: Standard aortic branching. The visualized aortic arch is unremarkable. No hemodynamically significant innominate or proximal subclavian artery stenosis. Right carotid system: CCA and ICA patent within the neck without stenosis. No significant atherosclerotic disease. Left carotid system: CCA and ICA patent within the neck without stenosis. No significant atherosclerotic disease. Tortuosity of the distal cervical ICA. Vertebral arteries: The vertebral arteries are codominant and patent within the neck bilaterally without hemodynamically significant stenosis. Skeleton: Advanced cervical spondylosis with multilevel disc space narrowing, posterior disc osteophytes, uncovertebral and facet hypertrophy. There is multilevel bony neural foraminal narrowing. Mild/moderate bony spinal canal stenosis throughout the cervical spine. Other neck: 2.6 cm nodule within the left thyroid lobe. No cervical lymphadenopathy or soft tissue neck mass is identified. Upper chest: No consolidation within the imaged lung apices Review of the MIP images confirms the above findings CTA HEAD FINDINGS Anterior circulation: The intracranial internal carotid arteries are patent. Calcified plaque within both vessels without significant stenosis. The M1 middle cerebral arteries are patent without significant stenosis. No M2 proximal branch occlusion or high-grade proximal stenosis is identified. The anterior cerebral arteries are patent. No intracranial aneurysm is identified. Posterior circulation: The intracranial vertebral arteries are patent. The basilar artery is patent. The posterior cerebral arteries are patent.  Sizable posterior communicating arteries are present bilaterally. Venous sinuses: Within limitations of contrast timing, no convincing thrombus. Anatomic variants: None significant Review of the MIP images confirms the above findings IMPRESSION: CTA neck: 1. The common carotid, internal carotid and vertebral arteries are patent within the neck without hemodynamically significant stenosis. 2. 2.6 cm left thyroid lobe nodule. Nonemergent thyroid ultrasound is recommended for further evaluation. 3. Advanced cervical spondylosis as described. CTA head: 1. No intracranial large vessel occlusion or proximal high-grade arterial stenosis. 2. Mild calcified plaque within the intracranial internal carotid arteries bilaterally. Electronically Signed   By: Jackey Loge DO   On: 08/09/2019 22:43    Procedures Procedures (including critical care time)  Medications Ordered in ED Medications  sodium chloride (PF) 0.9 % injection (has no administration in time range)  iohexol (OMNIPAQUE) 350 MG/ML injection 100 mL (100 mLs Intravenous Contrast Given 08/09/19 2143)    ED Course  I have reviewed the triage vital signs and the nursing notes.  Pertinent labs & imaging results that were available during my care of the patient were reviewed by me and considered in my medical decision making (see chart for details).    MDM Rules/Calculators/A&P                          Patient comes in a chief complaint of numbness to his lip, repetitive speech, dizziness described as vertigo.  His symptoms lasted for 7 minutes.  This was the fourth time he had similar episode.  Patient has history of hypertension.  Differential diagnosis includes TIA.  His ABCD 2 score is low. Other possibilities  include small vessel infarct, small vessel disease, MS. unlikely to be seizure.  CT angiogram head and neck ordered, it is negative.  Discussed the case with neurology.  Patient is from out of town and this is a weekend, there is no  certainty that we can get him in close follow-up.  Plan is to get MRI of the brain.  If the MRI is negative then he will follow-up with cardiology and neurology as an outpatient.  I have sent a message to cardiology service about giving patient an expedited follow-up.  Pt will head to [facility] for MRI.  Final Clinical Impression(s) / ED Diagnoses Final diagnoses:  TIA (transient ischemic attack)    Rx / DC Orders ED Discharge Orders    None       Derwood Kaplan, MD 08/10/19 0010

## 2019-08-11 ENCOUNTER — Telehealth: Payer: Self-pay | Admitting: General Practice

## 2019-08-11 NOTE — Telephone Encounter (Signed)
Gene Lawrence is calling requesting a New Patient appt due to recently being hospitalized. I advised Kylyn the first available at the [facility] office is 09/29/19. Graylin stated he lives in [place] and was advised to f/u either today or tomorrow before traveling back. Please advise.

## 2019-08-11 NOTE — Telephone Encounter (Signed)
Called patient back. Patient is suppose to fly back to [place] where he lives in a few days. Informed patient that he should follow up with a cardiologist back in [place] when he returns and establish care there. Consulted with DOD, Dr. Johney Frame, who agrees with plan.

## 2019-09-16 ENCOUNTER — Telehealth (HOSPITAL_BASED_OUTPATIENT_CLINIC_OR_DEPARTMENT_OTHER): Payer: BLUE CROSS/BLUE SHIELD | Admitting: Surgery

## 2022-02-22 IMAGING — MR MR HEAD W/O CM
6 of 10 series · 28 of 48 positions shown · non-contrast
Comparison: Prior CTs from 08/09/2019.

CLINICAL DATA: Initial evaluation for acute TIA.

EXAM:
MRI HEAD WITHOUT CONTRAST
TECHNIQUE: Multiplanar, multiecho pulse sequences of the brain and surrounding
structures were obtained without intravenous contrast.

[Series 2: DWI · axial · 3.0mm · 0.94mm/px · z∈[-92,+54]mm · 9 of 100 slices shown (1 of 2)]
[im 1/100]
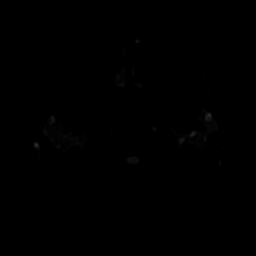
[im 13/100]
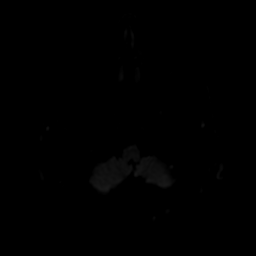
[im 25/100]
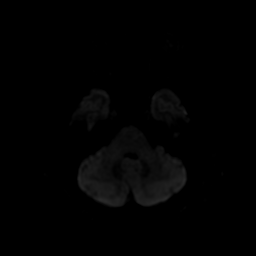
[im 38/100]
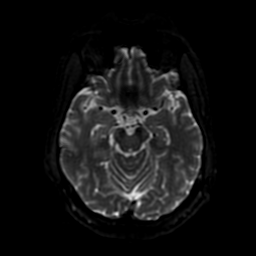
[im 50/100]
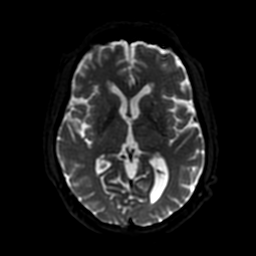
[im 62/100]
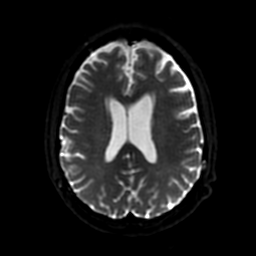
[im 75/100]
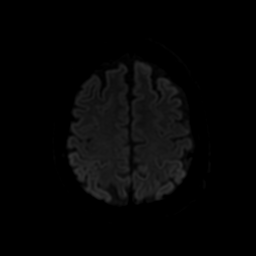
[im 87/100]
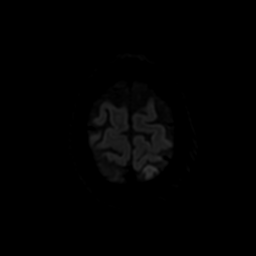
[im 100/100]
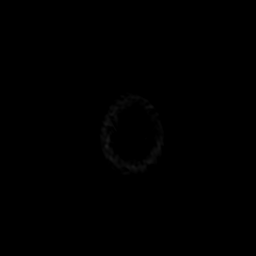

[Series 3: DWI · coronal · 4.0mm · 0.94mm/px · 7 of 74 slices shown (2 of 2)]
[im 1/74]
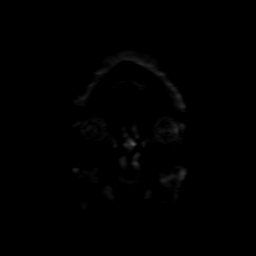
[im 13/74]
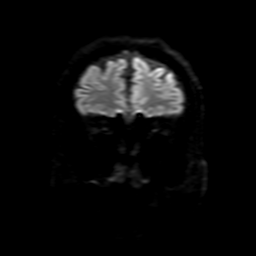
[im 25/74]
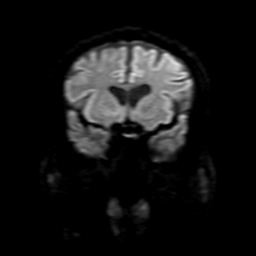
[im 37/74]
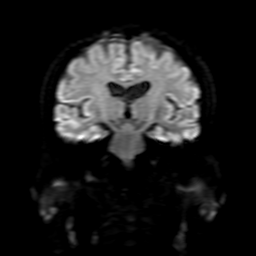
[im 49/74]
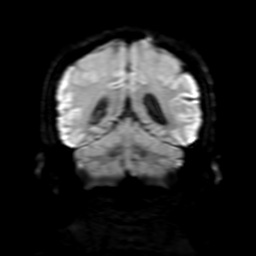
[im 61/74]
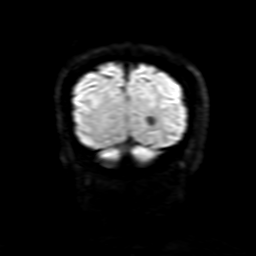
[im 74/74]
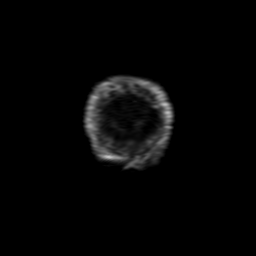

[Series 4: FLAIR · sagittal · 5.0mm · 0.23mm/px · 2 of 25 slices shown (1 of 2)]
[im 1/25]
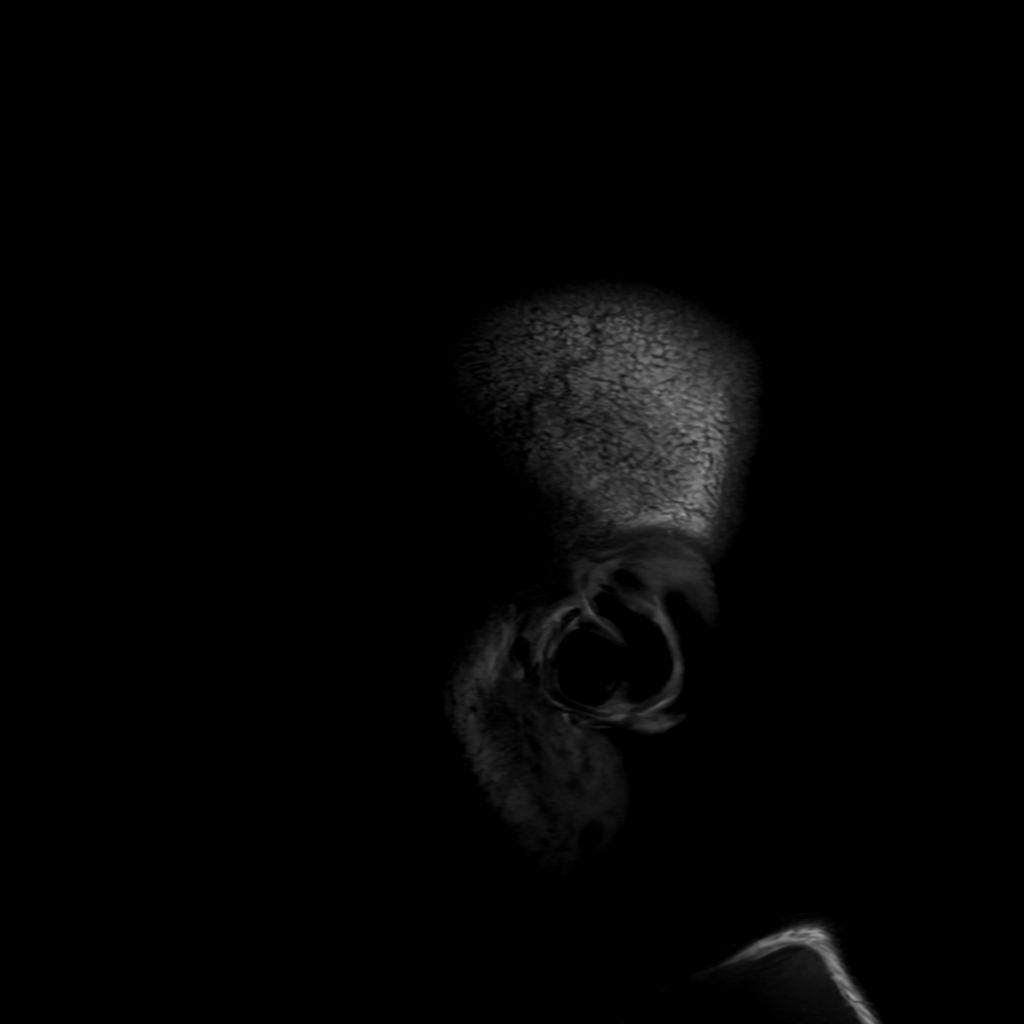
[im 25/25]
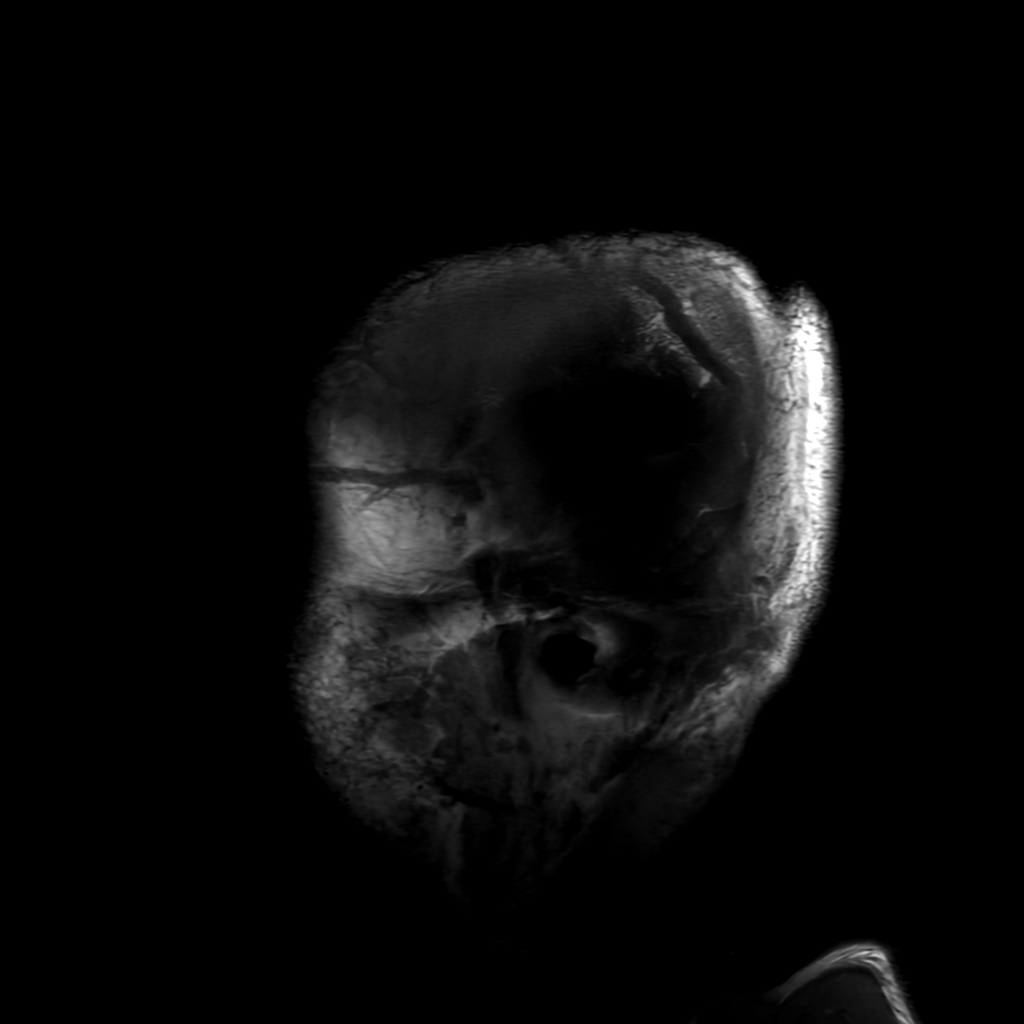

[Series 6: FLAIR · axial · 3.0mm · 0.45mm/px · z∈[-94,+55]mm · 2 of 26 slices shown (2 of 2)]
[im 1/26]
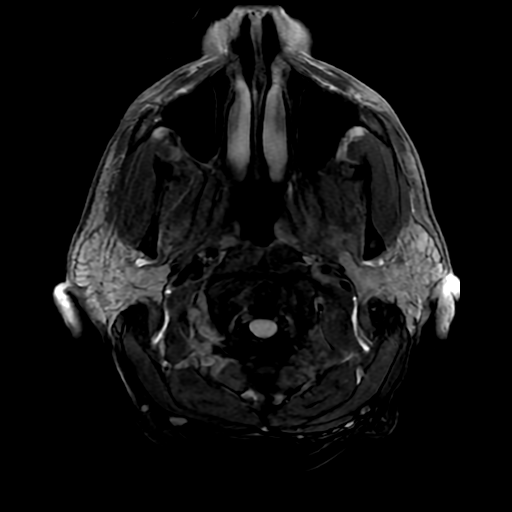
[im 26/26]
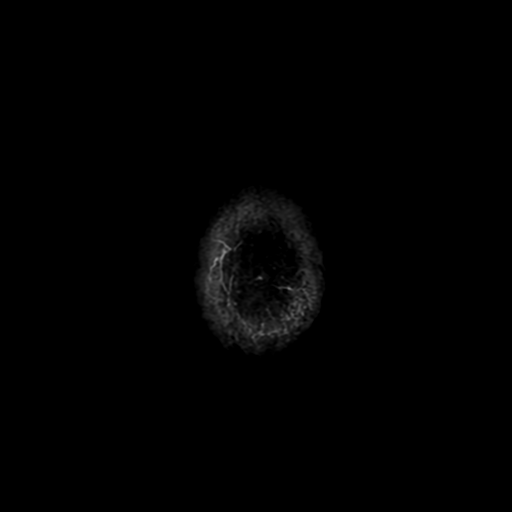

[Series 250: ADC · axial · 3.0mm · 0.94mm/px · z∈[-92,+54]mm · 5 of 50 slices shown (1 of 2)]
[im 1/50]
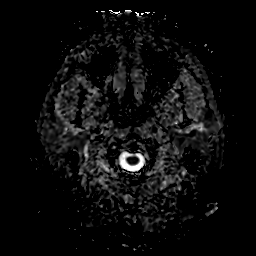
[im 13/50]
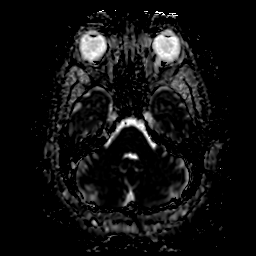
[im 25/50]
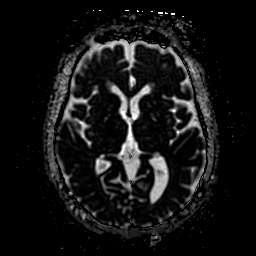
[im 37/50]
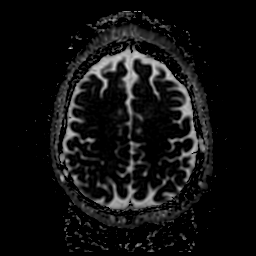
[im 50/50]
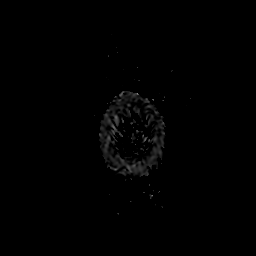

[Series 350: ADC · coronal · 4.0mm · 0.94mm/px · 3 of 37 slices shown (2 of 2)]
[im 1/37]
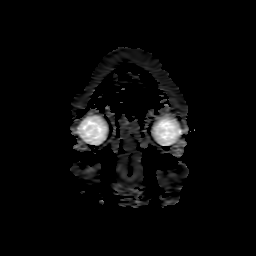
[im 19/37]
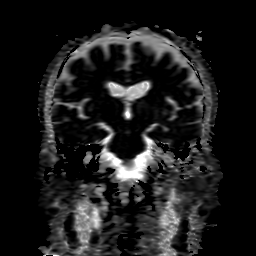
[im 37/37]
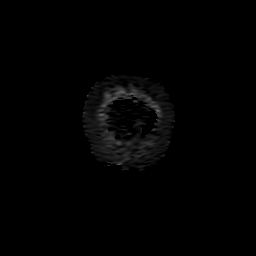

[28 of 48 positions shown; findings below may reference images not displayed]

FINDINGS: Brain: Cerebral volume within normal limits for patient age. Mild
patchy T2/FLAIR hyperintensity seen involving the periventricular
deep white matter both cerebral hemispheres, nonspecific, but most
likely related to chronic small vessel ischemic disease, mild in
nature.

No abnormal foci of restricted diffusion to suggest acute or
subacute ischemia. Gray-white matter differentiation well
maintained. No encephalomalacia to suggest chronic infarction. No
foci of susceptibility artifact to suggest acute or chronic
intracranial hemorrhage.

No mass lesion, midline shift or mass effect. No hydrocephalus. No
extra-axial fluid collection. Major dural sinuses are grossly
patent.

Pituitary gland and suprasellar region are normal. Midline
structures intact and normal.

Vascular: Major intracranial vascular flow voids well maintained and
normal in appearance.

Skull and upper cervical spine: Craniocervical junction normal.
Visualized upper cervical spine within normal limits. Bone marrow
signal intensity normal. No scalp soft tissue abnormality.

Sinuses/Orbits: Globes and orbital soft tissues within normal
limits.

Mild scattered mucosal thickening noted within the ethmoidal air
cells. Paranasal sinuses are otherwise clear. Small left mastoid
effusion noted. Right mastoid air cells clear. Inner ear structures
grossly normal.

Other: None.
IMPRESSION: 1. No acute intracranial abnormality.
2. Mild chronic microvascular ischemic disease for age.
